# Patient Record
Sex: Male | Born: 1980
Health system: Southern US, Community
[De-identification: ages and names within clinical notes are randomized; demographics above are authoritative.]

## PROBLEM LIST (undated history)

## (undated) DIAGNOSIS — E119 Type 2 diabetes mellitus without complications: Secondary | ICD-10-CM

## (undated) DIAGNOSIS — I10 Essential (primary) hypertension: Secondary | ICD-10-CM

## (undated) HISTORY — PX: EYE SURGERY: SHX253

---

## 1998-08-29 ENCOUNTER — Emergency Department (HOSPITAL_COMMUNITY): Admission: EM | Admit: 1998-08-29 | Discharge: 1998-08-29 | Payer: Self-pay | Admitting: Emergency Medicine

## 2001-04-16 ENCOUNTER — Emergency Department (HOSPITAL_COMMUNITY): Admission: EM | Admit: 2001-04-16 | Discharge: 2001-04-17 | Payer: Self-pay | Admitting: Emergency Medicine

## 2006-09-28 ENCOUNTER — Emergency Department (HOSPITAL_COMMUNITY): Admission: EM | Admit: 2006-09-28 | Discharge: 2006-09-28 | Payer: Self-pay | Admitting: Emergency Medicine

## 2014-11-15 ENCOUNTER — Encounter (HOSPITAL_BASED_OUTPATIENT_CLINIC_OR_DEPARTMENT_OTHER): Payer: Self-pay | Admitting: Emergency Medicine

## 2014-11-15 ENCOUNTER — Emergency Department (HOSPITAL_BASED_OUTPATIENT_CLINIC_OR_DEPARTMENT_OTHER)
Admission: EM | Admit: 2014-11-15 | Discharge: 2014-11-15 | Disposition: A | Discharge status: Home / Self Care | Payer: BC Managed Care – PPO | Attending: Emergency Medicine | Admitting: Emergency Medicine

## 2014-11-15 ENCOUNTER — Ambulatory Visit (HOSPITAL_BASED_OUTPATIENT_CLINIC_OR_DEPARTMENT_OTHER)
Admission: RE | Admit: 2014-11-15 | Discharge: 2014-11-15 | Disposition: A | Discharge status: Home / Self Care | Payer: BC Managed Care – PPO | Source: Ambulatory Visit | Attending: Emergency Medicine | Admitting: Emergency Medicine

## 2014-11-15 ENCOUNTER — Emergency Department (HOSPITAL_BASED_OUTPATIENT_CLINIC_OR_DEPARTMENT_OTHER): Payer: BC Managed Care – PPO

## 2014-11-15 DIAGNOSIS — E1165 Type 2 diabetes mellitus with hyperglycemia: Secondary | ICD-10-CM | POA: Insufficient documentation

## 2014-11-15 DIAGNOSIS — Z794 Long term (current) use of insulin: Secondary | ICD-10-CM | POA: Diagnosis not present

## 2014-11-15 DIAGNOSIS — M7989 Other specified soft tissue disorders: Secondary | ICD-10-CM | POA: Insufficient documentation

## 2014-11-15 DIAGNOSIS — Z792 Long term (current) use of antibiotics: Secondary | ICD-10-CM | POA: Insufficient documentation

## 2014-11-15 DIAGNOSIS — M79604 Pain in right leg: Secondary | ICD-10-CM | POA: Diagnosis not present

## 2014-11-15 DIAGNOSIS — L03115 Cellulitis of right lower limb: Secondary | ICD-10-CM | POA: Diagnosis not present

## 2014-11-15 DIAGNOSIS — Z23 Encounter for immunization: Secondary | ICD-10-CM | POA: Insufficient documentation

## 2014-11-15 DIAGNOSIS — R739 Hyperglycemia, unspecified: Secondary | ICD-10-CM

## 2014-11-15 DIAGNOSIS — L0390 Cellulitis, unspecified: Secondary | ICD-10-CM

## 2014-11-15 DIAGNOSIS — I10 Essential (primary) hypertension: Secondary | ICD-10-CM | POA: Insufficient documentation

## 2014-11-15 HISTORY — DX: Type 2 diabetes mellitus without complications: E11.9

## 2014-11-15 LAB — BASIC METABOLIC PANEL
Anion gap: 6 (ref 5–15)
BUN: 14 mg/dL (ref 6–23)
CO2: 31 mmol/L (ref 19–32)
Calcium: 8.9 mg/dL (ref 8.4–10.5)
Chloride: 96 mEq/L (ref 96–112)
Creatinine, Ser: 1.05 mg/dL (ref 0.50–1.35)
GFR calc non Af Amer: >90 mL/min (ref >90)
Glucose, Bld: 423 mg/dL — ABNORMAL HIGH (ref 70–99)
Potassium: 4.3 mmol/L (ref 3.5–5.1)
SODIUM: 133 mmol/L — AB (ref 135–145)

## 2014-11-15 LAB — CBC WITH DIFFERENTIAL/PLATELET
BASOS PCT: 0 % (ref 0–1)
Basophils Absolute: 0 10*3/uL (ref 0.0–0.1)
EOS ABS: 0 10*3/uL (ref 0.0–0.7)
Eosinophils Relative: 0 % (ref 0–5)
HCT: 39.7 % (ref 39.0–52.0)
HEMOGLOBIN: 13.7 g/dL (ref 13.0–17.0)
LYMPHS ABS: 2.3 10*3/uL (ref 0.7–4.0)
Lymphocytes Relative: 19 % (ref 12–46)
MCH: 30.4 pg (ref 26.0–34.0)
MCHC: 34.5 g/dL (ref 30.0–36.0)
MCV: 88 fL (ref 78.0–100.0)
MONOS PCT: 7 % (ref 3–12)
Monocytes Absolute: 0.9 10*3/uL (ref 0.1–1.0)
NEUTROS PCT: 74 % (ref 43–77)
Neutro Abs: 8.5 10*3/uL — ABNORMAL HIGH (ref 1.7–7.7)
Platelets: 236 10*3/uL (ref 150–400)
RBC: 4.51 MIL/uL (ref 4.22–5.81)
RDW: 11.2 % — ABNORMAL LOW (ref 11.5–15.5)
WBC: 11.7 10*3/uL — ABNORMAL HIGH (ref 4.0–10.5)

## 2014-11-15 LAB — CBG MONITORING, ED: Glucose-Capillary: 304 mg/dL — ABNORMAL HIGH (ref 70–99)

## 2014-11-15 LAB — D-DIMER, QUANTITATIVE (NOT AT ARMC): D DIMER QUANT: 0.49 ug{FEU}/mL — AB (ref 0.00–0.48)

## 2014-11-15 MED ORDER — INSULIN REGULAR HUMAN 100 UNIT/ML IJ SOLN
5.0000 [IU] | Freq: Once | INTRAMUSCULAR | Status: AC
  Administered 2014-11-15: 5 [IU] via SUBCUTANEOUS
  Filled 2014-11-15: qty 1

## 2014-11-15 MED ORDER — VANCOMYCIN HCL IN DEXTROSE 1-5 GM/200ML-% IV SOLN
1000.0000 mg | Freq: Once | INTRAVENOUS | Status: DC
  Filled 2014-11-15: qty 200

## 2014-11-15 MED ORDER — CEPHALEXIN 500 MG PO CAPS: 500.0000 mg | ORAL_CAPSULE | Freq: Four times a day (QID) | ORAL | Status: DC

## 2014-11-15 MED ORDER — SODIUM CHLORIDE 0.9 % IV BOLUS (SEPSIS)
1000.0000 mL | Freq: Once | INTRAVENOUS | Status: AC
  Administered 2014-11-15: 1000 mL via INTRAVENOUS

## 2014-11-15 MED ORDER — TETANUS-DIPHTH-ACELL PERTUSSIS 5-2.5-18.5 LF-MCG/0.5 IM SUSP
0.5000 mL | Freq: Once | INTRAMUSCULAR | Status: AC
  Administered 2014-11-15: 0.5 mL via INTRAMUSCULAR
  Filled 2014-11-15: qty 0.5

## 2014-11-15 MED ORDER — SULFAMETHOXAZOLE-TRIMETHOPRIM 800-160 MG PO TABS
1.0000 | ORAL_TABLET | Freq: Two times a day (BID) | ORAL | Status: DC
Start: 2014-11-15 — End: 2019-06-15

## 2014-11-15 NOTE — Discharge Instructions (Signed)
Cellulitis Take the antibiotics as prescribed. Return tomorrow for an ultrasound of your leg.  Establish care with a primary doctor to manage your blood pressure and blood sugar.  Have your leg checked in 48-72 hours if not improving.  Return to the ED sooner with worsening pain, redness, swelling, fever, or any other concerns. Cellulitis is an infection of the skin and the tissue beneath it. The infected area is usually red and tender. Cellulitis occurs most often in the arms and lower legs.  CAUSES  Cellulitis is caused by bacteria that enter the skin through cracks or cuts in the skin. The most common types of bacteria that cause cellulitis are staphylococci and streptococci. SIGNS AND SYMPTOMS   Redness and warmth.  Swelling.  Tenderness or pain.  Fever. DIAGNOSIS  Your health care provider can usually determine what is wrong based on a physical exam. Blood tests may also be done. TREATMENT  Treatment usually involves taking an antibiotic medicine. HOME CARE INSTRUCTIONS   Take your antibiotic medicine as directed by your health care provider. Finish the antibiotic even if you start to feel better.  Keep the infected arm or leg elevated to reduce swelling.  Apply a warm cloth to the affected area up to 4 times per day to relieve pain.  Take medicines only as directed by your health care provider.  Keep all follow-up visits as directed by your health care provider. SEEK MEDICAL CARE IF:   You notice red streaks coming from the infected area.  Your red area gets larger or turns dark in color.  Your bone or joint underneath the infected area becomes painful after the skin has healed.  Your infection returns in the same area or another area.  You notice a swollen bump in the infected area.  You develop new symptoms.  You have a fever. SEEK IMMEDIATE MEDICAL CARE IF:   You feel very sleepy.  You develop vomiting or diarrhea.  You have a general ill feeling (malaise)  with muscle aches and pains. MAKE SURE YOU:   Understand these instructions.  Will watch your condition.  Will get help right away if you are not doing well or get worse. Document Released: 07/23/2005 Document Revised: 02/27/2014 Document Reviewed: 12/29/2011 [facility] Patient Information 2015 [place], [place]. This information is not intended to replace advice given to you by your health care provider. Make sure you discuss any questions you have with your health care provider.   Emergency Department Resource Guide 1) Find a Doctor and Pay Out of Pocket Although you won't have to find out who is covered by your insurance plan, it is a good idea to ask around and get recommendations. You will then need to call the office and see if the doctor you have chosen will accept you as a new patient and what types of options they offer for patients who are self-pay. Some doctors offer discounts or will set up payment plans for their patients who do not have insurance, but you will need to ask so you aren't surprised when you get to your appointment.  2) Contact Your Local Health Department Not all health departments have doctors that can see patients for sick visits, but many do, so it is worth a call to see if yours does. If you don't know where your local health department is, you can check in your phone book. The CDC also has a tool to help you locate your state's health department, and many state websites also have  listings of all of their local health departments.  3) Find a Walk-in Clinic If your illness is not likely to be very severe or complicated, you may want to try a walk in clinic. These are popping up all over the country in pharmacies, drugstores, and shopping centers. They're usually staffed by nurse practitioners or physician assistants that have been trained to treat common illnesses and complaints. They're usually fairly quick and inexpensive. However, if you have serious medical issues  or chronic medical problems, these are probably not your best option.  No Primary Care Doctor: - Call Health Connect at  [phone] - they can help you locate a primary care doctor that  accepts your insurance, provides certain services, etc. - Physician Referral Service- [phone]  Chronic Pain Problems: Organization         Address  Phone   Notes  [facility]  [phone] Patients need to be referred by their primary care doctor.   Medication Assistance: Organization         Address  Phone   Notes  [facility] Medication [facility] [address] [place], [place] [address] [phone] --Must be a resident of [facility] [place] -- Must have NO insurance coverage whatsoever (no Medicaid/ Medicare, etc.) -- The pt. MUST have a primary care doctor that directs their care regularly and follows them in the community   MedAssist  [phone]   [employer]  [phone]    Agencies that provide inexpensive medical care: Organization         Address  Phone   Notes  [facility]  [phone]   [facility]    [phone]   [facility] [address] [place], [place] [address] [phone]   [facility] [address], [place] [phone]   [facility]anned Parenthood    [phone]   [facility]    [phone]   [facility]  [address], [place] Phone:  [phone], Fax:  [phone] Hours of Operation:  9 am - 6 pm, M-F.  Also accepts Medicaid/Medicare and self-pay.  [facility]  [address], [place] Phone: [phone], Fax: [phone]. Hours of Operation:  8:30 am - 5:30 pm, M-F.  Also accepts Medicaid and self-pay.  [facility] [place] [address], [place] Phone: [phone]   [facility] [address], [place], [place]  [phone], Ext. [phone] Mondays & Thursdays: 7-9 AM.  First 15 patients are seen on a first come, first serve basis.    Medicaid-accepting [facility] Providers:  Organization         Address  Phone   Notes  [facility] [address], [place] [phone] Also accepts self-pay patients.  [facility] [address], [place]  [phone]   [facility] [address], [place] [phone]   [facility] [address] [place] [address], [place] [phone]   Renaye RakersVeita Bland [address], [place]   [phone] Only accepts [place] Access [place] patients after they have their name applied to their card.   Self-Pay (no insurance) in [facility] [place]:  [employer]ganization         Address  Phone  Notes  Sickle Cell Patients, [place] [phone]   [facility] [place] [phone]   [facility] [place]  [place], [place], [place] [phone]   [facility]/Dr. Osei-Bonsu  [address], [place] or [address], [place] [phone] Phone number for both [place] and [place] locations is the same.  [facility] [address], [place] [phone]   [facility] [address] [place] or [address] [phone]   [facility] [address] [place] [phone], phone; [phone], fax Sees patients 1st and 3rd Saturday of every month.  Must not qualify for public or private insurance (i.e. Medicaid, Medicare, Advance Health Choice, Veterans' Benefits)  Household income should be no more than 200% of the poverty level The clinic cannot treat you if you are pregnant or think you are pregnant  Sexually transmitted  diseases are not treated at the clinic.    Dental Care: Organization         Address  Phone  Notes  [facility] Department of [facility] [phone] Accepts children up to age 15 who are enrolled in [place] or [place]; pregnant women with a Medicaid card; and children who have applied for Medicaid or [place] Health Choice, but were declined, whose parents can pay a reduced fee at time of service.  [facility]  [address], [place] [phone] Accepts children up to age 36 who are enrolled in [place] or [place]; pregnant women with a Medicaid card; and children who have applied for Medicaid or [place] Health Choice, but were declined, whose parents can pay a reduced fee at time of service.  [facility]  [place] [phone] Patients are seen by appointment only. Walk-ins are not accepted. [place] will see patients 55 years of age and older. Monday - Tuesday (8am-5pm) Most Wednesdays (8:30-5pm) $30 per visit, cash only  [facility]  [address], [facility] [phone] Patients are seen by appointment only. Walk-ins are not accepted. [place] will see patients 37 years of age and older. One Wednesday Evening (Monthly: Volunteer Based).  $30 per visit, cash only  [place]  [phone] for adults; Children under age 70, call Graduate Pediatric Dentistry at [phone]. Children aged 67-14, please call [phone] to request a pediatric application.  Dental services are provided in all areas of dental care including fillings, crowns and bridges, complete and partial dentures, implants, gum treatment, root canals, and extractions. Preventive care is also provided. Treatment is provided to both adults and children. Patients are selected via a  lottery and there is often a waiting list.   [facility] [address] [place]  [phone] [email or web address]   [facility] [address] [place], [place] [phone], Ext. [phone] Second and Fourth Thursday of each month, opens at 6:30 AM; Clinic ends at 9 AM.  Patients are seen on a first-come first-served basis, and a limited number are seen during each clinic.   [facility]  [address] Hillard Danker [place], [place] [phone]  Eligibility Requirements You must have lived in [place], [place], or [place] counties for at least the last three months.   You cannot be eligible for state or federal sponsored [employer], including [employer], [place], or [employer].   You generally cannot be eligible for healthcare insurance through your employer.    How to apply: Eligibility screenings are held every Tuesday and Wednesday afternoon from 1:00 pm until 4:00 pm. You do not need an appointment for the interview!  [facility] [address] [place], [place]   [place]  [place] Department  [place]  [phone]    Behavioral Health Resources in the Community: Intensive Outpatient Programs Organization         Address  Phone  Notes  [place]. [address] [place], [place] [phone]   [facility] Outpatient [address], [place], [place]   ADS: Alcohol & Drug Svcs [address] [place], [place]   [place] [address],  [place], [place] or [phone]   Substance Abuse Resources Organization         Address  Phone  Notes  Alcohol and Drug Services  [phone]   [place]  [phone]   The [place]   Chinita Pester  [phone]   Residential &  Outpatient Substance Abuse Program  [phone]   Psychological Services Organization         Address  Phone  Notes  [facility]  [place]  [phone]   [place] [address], [place] or [phone]    Mobile Crisis Teams Organization         Address  Phone  Notes  [facility], Mobile Crisis Care Unit  [phone]   [facility]  [address] [place], [place]   [place] Levels [address] [place] [phone]    Self-Help/Support Groups Organization         Address  Phone             Notes  [place]. of [place] - variety of support groups  [place] Call for more information  Narcotics Anonymous (NA), [facility] [address], [employer] [place]  2 meetings at this location   Special educational needs teacher         Address  Phone  Notes  [facility]idential [facility] [place],    [place]  [phone]   [facility]  [address], [place] [id number], [place], [place]   [place], [place] [phone] Admissions: 8am-3pm M-F  Incentives Substance [place] [address]    [place], [place] [phone]   [facility] [address] Jadene Pierini [place], [place]   [facility] [address]  [place], [place]   [facility] - Intensive Outpatient [address], [place], [place]   [facility] [place].,  [place], [place] or [phone]   [facility] (RTS) [address], [place], [place] Accepts Medicaid  [facility] [place] [address]  [place] [phone] Substance Abuse/Addiction Treatment   [facility] Resources Organization  Address  Phone  Notes  °[facility]  [phone]   °Julie Brannon, PhD [address] [place], [place]   [phone] or [phone]   °[facility]   [address] °[place], [place] [phone]   °[facility] [address], [place], [place] [phone] Insurance/Medicaid/sponsorship through [facility]  °[facility] [address]                                    [place], [place] [phone] Therapy/tele-psych/case  °[facility] [address]  ° [place], [place] [phone]    °Dr. Arfeen  [phone]   °[facility] [place]  [employer] [place] [facility] 1) [address], [place] °2) [address], [place] °3)  [address], [place] [phone]) [phone] °([phone] ° °([phone]   °[place] Child Abuse Hotline [phone]) [phone] or (336) [phone] (After Hours)    ° ° ° °

## 2014-11-15 NOTE — ED Provider Notes (Signed)
CSN: [id number]     Arrival date & time 11/15/14  0037 History   First MD Initiated Contact with Patient 11/15/14 [phone]     Chief Complaint  Patient presents with  . leg swelling      (Consider location/radiation/quality/duration/timing/severity/associated sxs/prior Treatment) HPI Comments: Patient states he injured his right shin against a metal trailer around Christmas. He did not seek treatment at the time. He had several open wounds. Over the past 2 days had increasing pain, swelling and redness to his right anterior shin. He is a diabetic on insulin but does not check his blood sugars. He denies any fevers, chills, nausea vomiting. No chest pain or shortness of breath. No history of blood clots. Admits to picking at one of the wounds on his right shin. He is concerned for infection of the leg. Denies IV drug abuse  The history is provided by the patient.    Past Medical History  Diagnosis Date  . Diabetes mellitus without complication    History reviewed. No pertinent past surgical history. History reviewed. No pertinent family history. History  Substance Use Topics  . Smoking status: Never Smoker   . Smokeless tobacco: Not on file  . Alcohol Use: No    Review of Systems  Constitutional: Negative for fever, activity change and appetite change.  HENT: Negative for congestion and rhinorrhea.   Eyes: Negative for visual disturbance.  Respiratory: Negative for cough, chest tightness and shortness of breath.   Cardiovascular: Negative for chest pain.  Gastrointestinal: Negative for nausea, vomiting and abdominal pain.  Genitourinary: Negative for dysuria and hematuria.  Musculoskeletal: Positive for arthralgias. Negative for joint swelling.  Skin: Positive for rash and wound.  Neurological: Negative for dizziness, weakness and headaches.  A complete 10 system review of systems was obtained and all systems are negative except as noted in the HPI and PMH.      Allergies    Review of patient's allergies indicates no known allergies.  Home Medications   Prior to Admission medications   Medication Sig Start Date End Date Taking? Authorizing Provider  insulin aspart (NOVOLOG) 100 unit/mL injection Inject into the skin 3 (three) times daily before meals.   Yes Historical Provider, MD  cephALEXin (KEFLEX) 500 MG capsule Take 1 capsule (500 mg total) by mouth 4 (four) times daily. 11/15/14   Glynn Octave, MD  sulfamethoxazole-trimethoprim (SEPTRA DS) 800-160 MG per tablet Take 1 tablet by mouth 2 (two) times daily. 11/15/14   Glynn Octave, MD   BP 153/102 mmHg  Pulse 99  Temp(Src) 98.6 F (37 C) (Oral)  Resp 16  Ht 6' (1.829 m)  Wt 195 lb (88.451 kg)  BMI 26.44 kg/m2  SpO2 96% Physical Exam  Constitutional: He is oriented to person, place, and time. He appears well-developed and well-nourished. No distress.  HENT:  Head: Normocephalic and atraumatic.  Mouth/Throat: Oropharynx is clear and moist. No oropharyngeal exudate.  Eyes: Conjunctivae and EOM are normal. Pupils are equal, round, and reactive to light.  Neck: Normal range of motion. Neck supple.  No meningismus.  Cardiovascular: Normal rate, regular rhythm, normal heart sounds and intact distal pulses.   No murmur heard. Pulmonary/Chest: Effort normal and breath sounds normal. No respiratory distress.  Abdominal: Soft. There is no tenderness. There is no rebound and no guarding.  Musculoskeletal: Normal range of motion. He exhibits tenderness. He exhibits no edema.  Erythema to right shin with central hyperkeratotic scabbed lesion. Intact DP and PT pulses Edema of right shin  and ankle. Negative Homans sign  Both right and left leg have several scabbed areas of previous wounds.  Neurological: He is alert and oriented to person, place, and time. No cranial nerve deficit. He exhibits normal muscle tone. Coordination normal.  No ataxia on finger to nose bilaterally. No pronator drift. 5/5 strength  throughout. CN 2-12 intact. Negative Romberg. Equal grip strength. Sensation intact. Gait is normal.   Skin: Skin is warm.  Psychiatric: He has a normal mood and affect. His behavior is normal.  Nursing note and vitals reviewed.     ED Course  Procedures (including critical care time) Labs Review Labs Reviewed  CBC WITH DIFFERENTIAL - Abnormal; Notable for the following:    WBC 11.7 (*)    RDW 11.2 (*)    Neutro Abs 8.5 (*)    All other components within normal limits  BASIC METABOLIC PANEL - Abnormal; Notable for the following:    Sodium 133 (*)    Glucose, Bld 423 (*)    All other components within normal limits  D-DIMER, QUANTITATIVE - Abnormal; Notable for the following:    D-Dimer, Quant 0.49 (*)    All other components within normal limits  CBG MONITORING, ED - Abnormal; Notable for the following:    Glucose-Capillary 304 (*)    All other components within normal limits    Imaging Review Dg Tibia/fibula Right  11/15/2014   CLINICAL DATA:  Injury 4 weeks ago with wound in the mid shin. Redness for 3 days with possible cellulitis.  EXAM: RIGHT TIBIA AND FIBULA - 2 VIEW  COMPARISON:  None.  FINDINGS: There is chronic irregularity of the lateral malleolus compatible with remote injury. No acute fracture or malalignment. No opaque foreign body or subcutaneous gas.  IMPRESSION: No acute findings.   Electronically Signed   By: Tiburcio PeaJonathan  Watts M.D.   On: 11/15/2014 01:49     EKG Interpretation None      MDM   Final diagnoses:  Cellulitis  Essential hypertension  Hyperglycemia   Cellulitis to right leg. Patient is nontoxic and afebrile. Bedside ultrasound shows no fluid collection or drainable abscess.  WBC 11.7. Hyperglycemia without evidence of DKA. X-ray negative for foreign body or soft tissue gas.  Treat for cellulitis. DVT considered less likely. D-dimer 0.49. No chest pain or shortness of breath. Doubt PE. Low suspicion for DVT but will check doppler in AM.   Patient concerned about blood thinner so will defer lovenox at this time.  HR improved to 90s.  BP remains elevated. Blood sugar improved to 304.  D/w patient need for followup with PCP regarding BP and better glucose control. Advised to have cellulitis rechecked by PCP in 2 days. Return to the ED sooner with spreading redness, fever, worsening pain, or any other concerns.    EMERGENCY DEPARTMENT US SOFT TISSUE INTERPRETATION "Study: Limited Ultrasound of the noted body part in comments below"  INDICATIONS: Soft tissue infection Multiple views of the body part are obtained with a multi-frequency linear probe  PERFORMED BY:  Myself  IMAGES ARCHIVED?: Yes  SIDE:Right   BODY PART:Lower extremity  FINDINGS: No abcess noted and Cellulitis present  LIMITATIONS:  Body Habitus  INTERPRETATION:  No abcess noted and Cellulitis present  COMMENT:        Glynn OctaveStephen Tell Rozelle, MD 11/15/14 [phone]

## 2014-11-15 NOTE — ED Notes (Signed)
Pt states that his right leg has been swelling since Sunday

## 2014-11-15 NOTE — ED Notes (Signed)
C/o redness, swelling and pain to rt lower leg x 2 days  States had inj 4 weeks ago

## 2015-07-10 IMAGING — CR DG TIBIA/FIBULA 2V*R*
4 series · 4 of 4 positions shown · non-contrast
Comparison: None.

CLINICAL DATA: Injury 4 weeks ago with wound in the mid shin.
Redness for 3 days with possible cellulitis.

EXAM:
RIGHT TIBIA AND FIBULA - 2 VIEW

[t tib/fib ap right (1 of 2)]
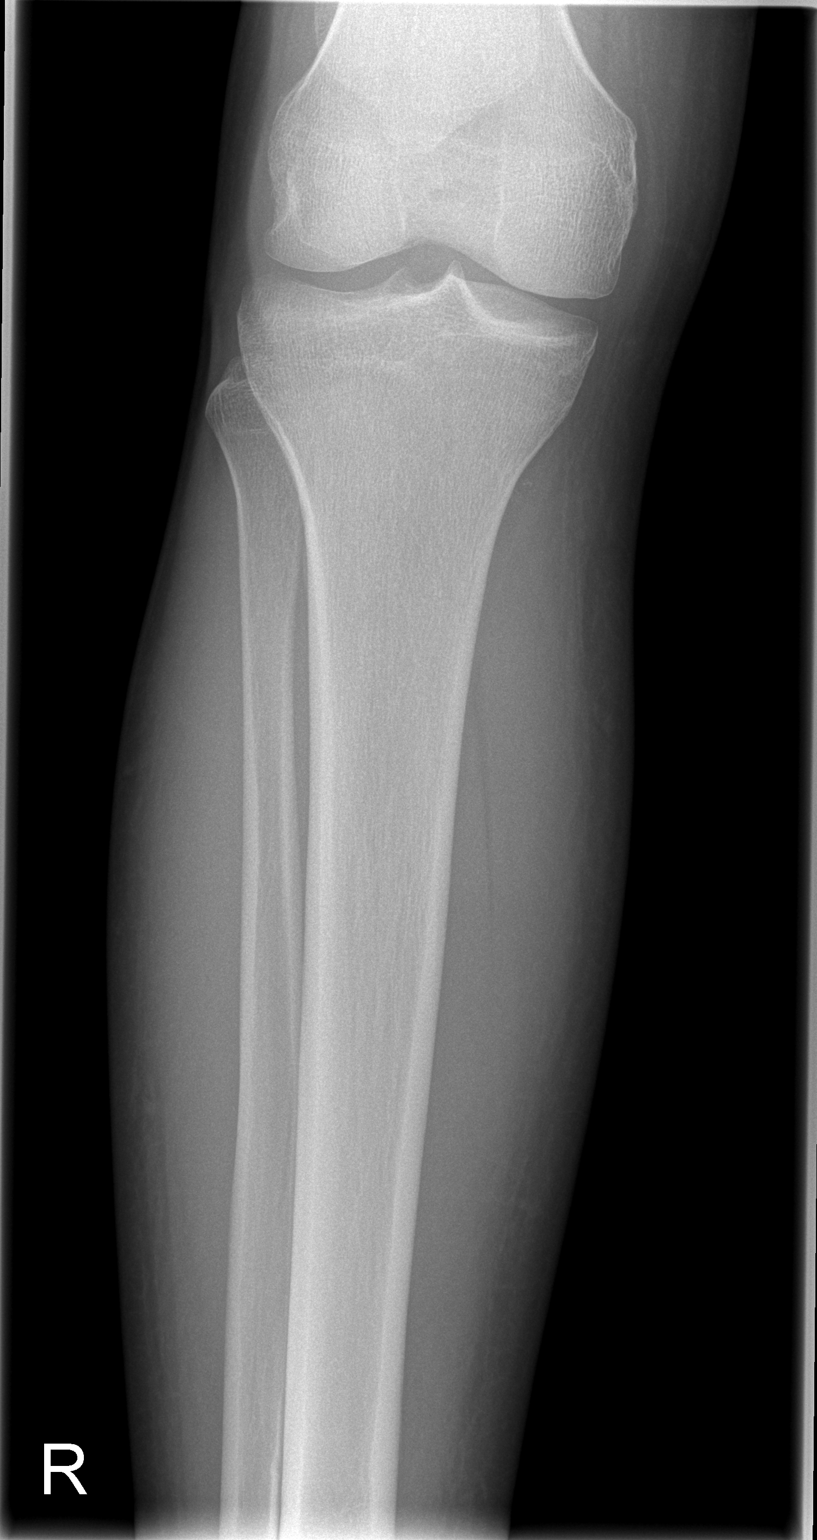

[t tib/fib ap right (2 of 2)]
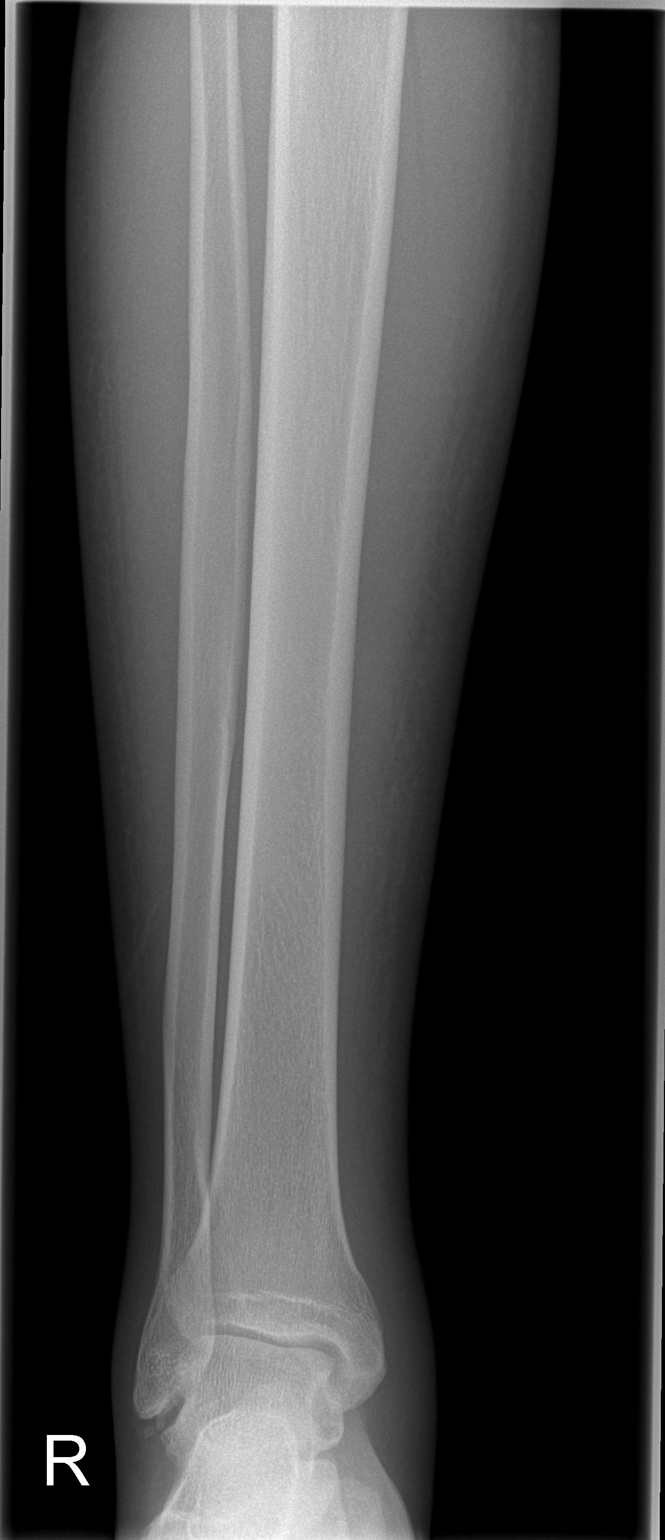

[t tib/fib lat right (1 of 2)]
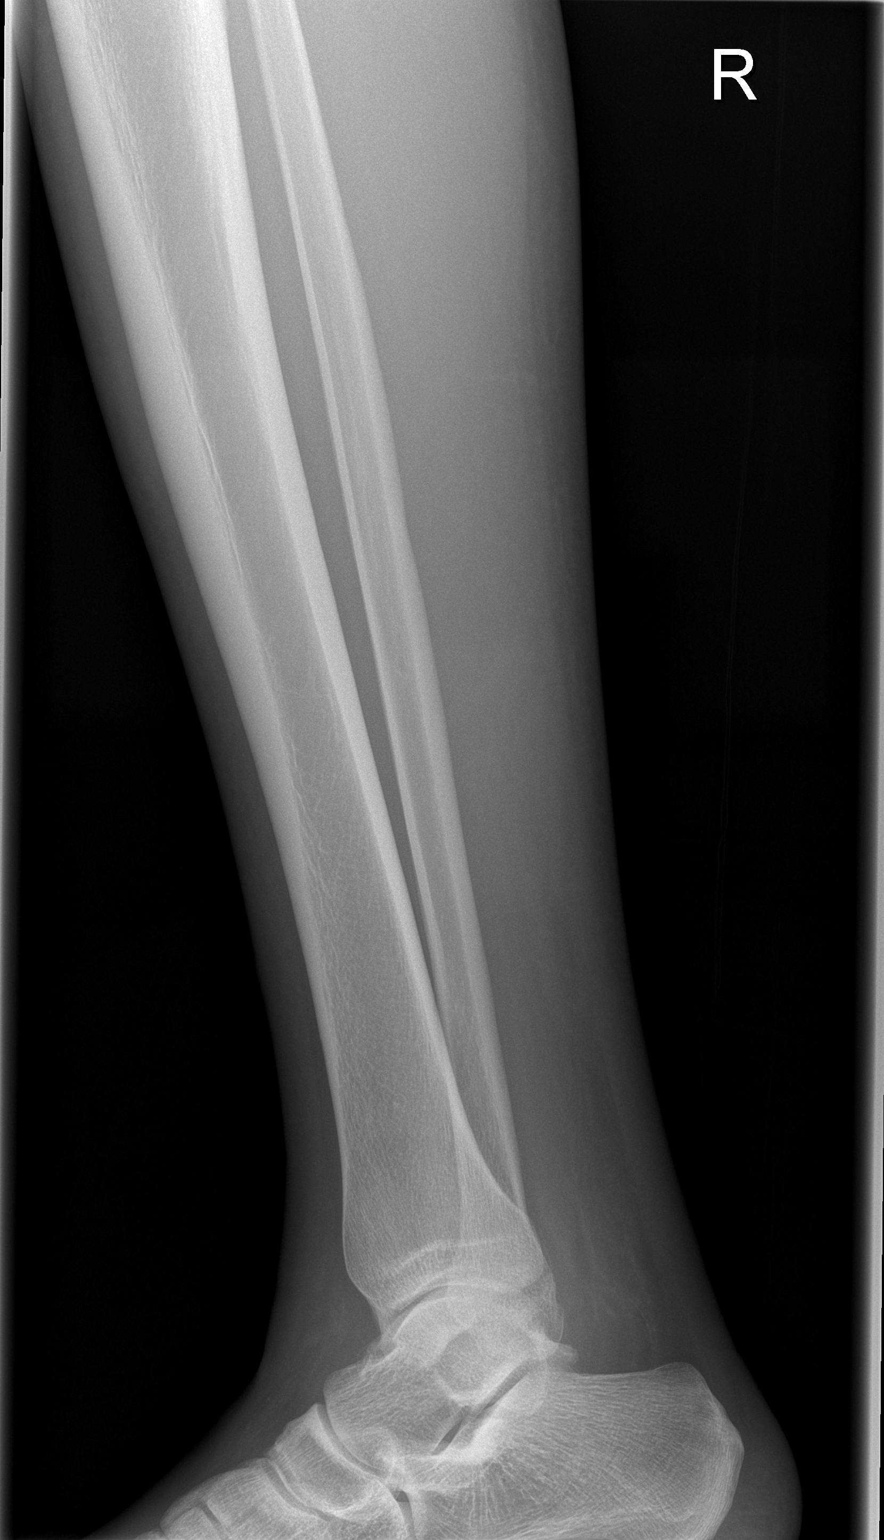

[t tib/fib lat right (2 of 2)]
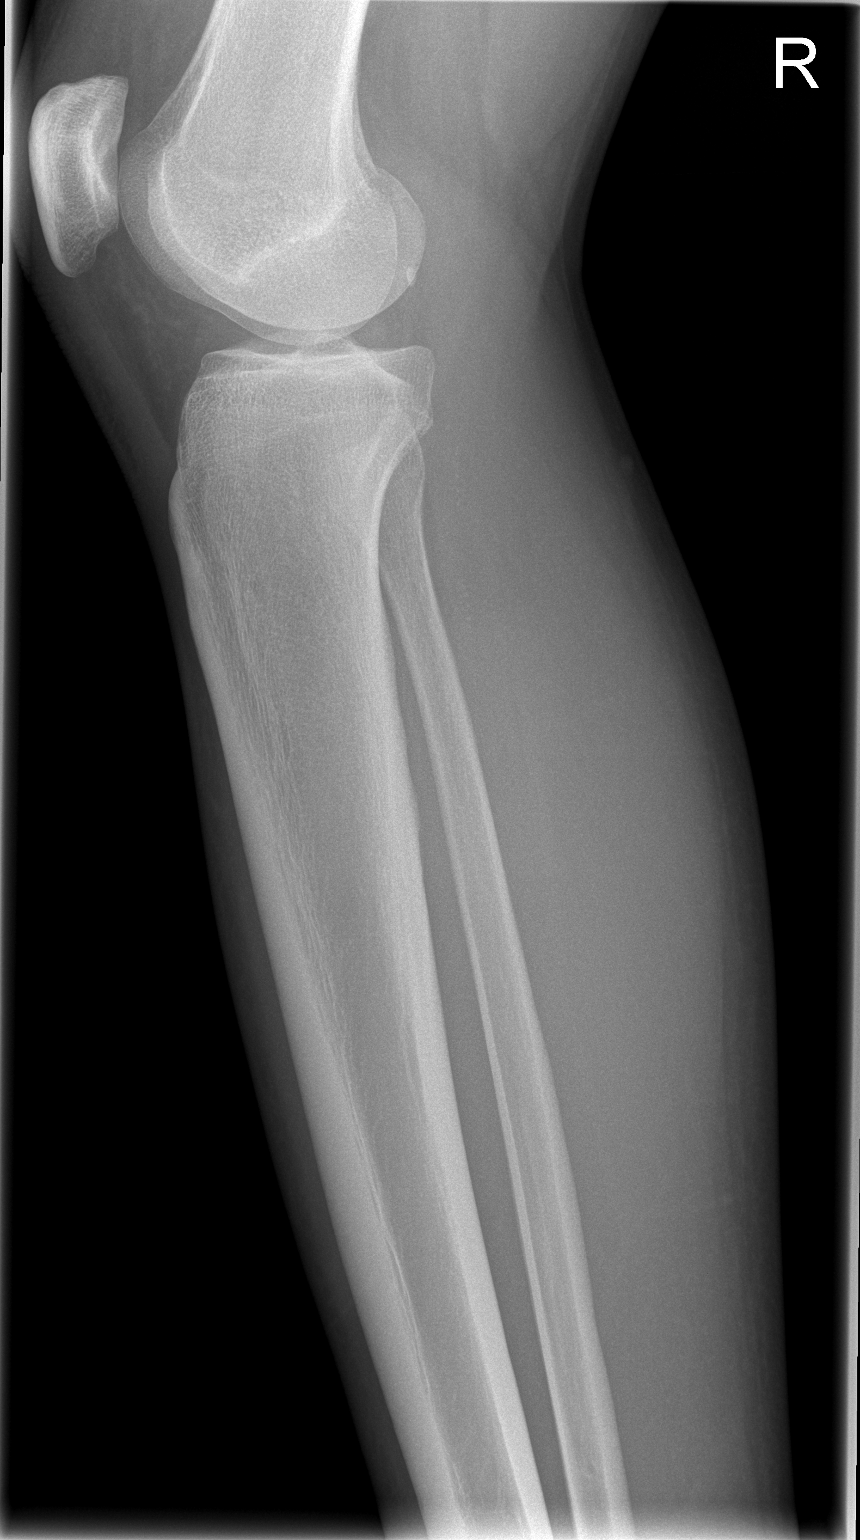

[4 of 4 positions shown; findings below may reference images not displayed]

FINDINGS: There is chronic irregularity of the lateral malleolus compatible
with remote injury. No acute fracture or malalignment. No opaque
foreign body or subcutaneous gas.
IMPRESSION: No acute findings.

## 2019-06-15 ENCOUNTER — Encounter: Payer: Self-pay | Admitting: Emergency Medicine

## 2019-06-15 ENCOUNTER — Other Ambulatory Visit: Payer: Self-pay

## 2019-06-15 ENCOUNTER — Ambulatory Visit
Admission: EM | Admit: 2019-06-15 | Discharge: 2019-06-15 | Disposition: A | Discharge status: Home / Self Care | Payer: BC Managed Care – PPO | Attending: Family Medicine | Admitting: Family Medicine

## 2019-06-15 DIAGNOSIS — K029 Dental caries, unspecified: Secondary | ICD-10-CM

## 2019-06-15 HISTORY — DX: Essential (primary) hypertension: I10

## 2019-06-15 MED ORDER — AMOXICILLIN-POT CLAVULANATE 875-125 MG PO TABS: 1.0000 | ORAL_TABLET | Freq: Two times a day (BID) | ORAL | 0 refills | Status: DC

## 2019-06-15 MED ORDER — HYDROCODONE-ACETAMINOPHEN 5-325 MG PO TABS: 1.0000 | ORAL_TABLET | Freq: Three times a day (TID) | ORAL | 0 refills | Status: DC | PRN

## 2019-06-15 NOTE — ED Provider Notes (Signed)
[facility]    CSN: [id number] Arrival date & time: 06/15/19  1238  History   Chief Complaint Chief Complaint  Patient presents with  . Dental Pain   HPI  38 year old male presents with dental pain.  Patient reports that he developed dental pain approximately 2 days ago.  He has not seen his dentist recently.  He states that his pain is 10/10 in severity.  Pain is predominantly located in the lower dentition (left incisor).  He has very poor dentition and rotting teeth and missing teeth throughout.  He has taken ibuprofen without resolution.  He has called his dentist and has an upcoming appointment.  No fever.  No reports of facial swelling.  No other complaints at this time.  History reviewed as below. Past Medical History:  Diagnosis Date  . Diabetes mellitus without complication ([place])   . Hypertension     Home Medications    Prior to Admission medications   Medication Sig Start Date End Date Taking? Authorizing Provider  amLODipine (NORVASC) 10 MG tablet Take 1 tablet by mouth daily 01/02/19  Yes [provider]  insulin aspart (NOVOLOG) 100 unit/mL injection Inject into the skin 3 (three) times daily before meals.   Yes [provider]  insulin NPH Human (NOVOLIN N) 100 UNIT/ML injection Inject 30 Units under the skin 2 times daily before meals 03/30/17  Yes [provider]  lisinopril (ZESTRIL) 20 MG tablet Take by mouth. 03/30/17  Yes [provider]  amoxicillin-clavulanate (AUGMENTIN) 875-125 MG tablet Take 1 tablet by mouth every 12 (twelve) hours. 06/15/19   Coral Spikes, DO  HYDROcodone-acetaminophen (NORCO/VICODIN) 5-325 MG tablet Take 1 tablet by mouth every 8 (eight) hours as needed. 06/15/19   Coral Spikes, DO   Social History Social History   Tobacco Use  . Smoking status: Never Smoker  . Smokeless tobacco: Never Used  Substance Use Topics  . Alcohol use: No  . Drug use: No    Allergies   Patient has no known  allergies.   Review of Systems Review of Systems  Constitutional: Negative for fever.  HENT: Positive for dental problem.    Physical Exam Triage Vital Signs ED Triage Vitals  Enc Vitals Group     BP 06/15/19 1256 (!) 160/90     Pulse Rate 06/15/19 1256 100     Resp 06/15/19 1256 18     Temp 06/15/19 1256 99.4 F (37.4 C)     Temp Source 06/15/19 1256 Oral     SpO2 06/15/19 1256 97 %     Weight 06/15/19 1254 213 lb (96.6 kg)     Height 06/15/19 1254 6' (1.829 m)     Head Circumference --      Peak Flow --      Pain Score 06/15/19 1253 10     Pain Loc --      Pain Edu? --      Excl. in [place]? --    Updated Vital Signs BP (!) 160/90 (BP Location: Right Arm)   Pulse 100   Temp 99.4 F (37.4 C) (Oral)   Resp 18   Ht 6' (1.829 m)   Wt 96.6 kg   SpO2 97%   BMI 28.89 kg/m   Visual Acuity Right Eye Distance:   Left Eye Distance:   Bilateral Distance:    Right Eye Near:   Left Eye Near:    Bilateral Near:     Physical Exam Vitals  signs and nursing note reviewed.  Constitutional:      General: He is not in acute distress.    Appearance: Normal appearance.  HENT:     Head: Normocephalic and atraumatic.     Mouth/Throat:      Comments: Patient has numerous missing teeth.  Gum disease noted.  Pain is located at the labeled tooth tooth is discolored. Eyes:     General:        Right eye: No discharge.        Left eye: No discharge.     Conjunctiva/sclera: Conjunctivae normal.  Cardiovascular:     Rate and Rhythm: Regular rhythm. Tachycardia present.  Pulmonary:     Effort: Pulmonary effort is normal.     Breath sounds: Normal breath sounds. No wheezing, rhonchi or rales.  Neurological:     Mental Status: He is alert.  Psychiatric:        Mood and Affect: Mood normal.    UC Treatments / Results  Labs (all labs ordered are listed, but only abnormal results are displayed) Labs Reviewed - No data to display  EKG   Radiology No results found.  Procedures  Procedures (including critical care time)  Medications Ordered in UC Medications - No data to display  Initial Impression / Assessment and Plan / UC Course  I have reviewed the triage vital signs and the nursing notes.  Pertinent labs & imaging results that were available during my care of the patient were reviewed by me and considered in my medical decision making (see chart for details).    38 year old male presents with dental pain.  Suspected infection.  Placing on Augmentin.  Vicodin as needed for pain.  [place] controlled substance database reviewed.  No concerns for abuse.  Advised to see dentist.  Final Clinical Impressions(s) / UC Diagnoses   Final diagnoses:  Pain due to dental caries     Discharge Instructions     Medications as prescribed.  Be sure to see your dentist.  Take care  Dr. Adriana Simasook    ED Prescriptions    Medication Sig Dispense Auth. Provider   amoxicillin-clavulanate (AUGMENTIN) 875-125 MG tablet Take 1 tablet by mouth every 12 (twelve) hours. 14 tablet Deontray Hunnicutt G, DO   HYDROcodone-acetaminophen (NORCO/VICODIN) 5-325 MG tablet Take 1 tablet by mouth every 8 (eight) hours as needed. 10 tablet Tommie Samsook, Cattleya Dobratz G, DO     Controlled Substance Prescriptions [place] Controlled Substance Registry consulted? Yes, I have consulted the [place] Controlled Substances Registry for this patient, and feel the risk/benefit ratio today is favorable for proceeding with this prescription for a controlled substance.   Tommie SamsCook, Emilly Lavey G, [place] 06/15/19 1444

## 2019-06-15 NOTE — ED Triage Notes (Signed)
Patient c/o tooth pain that started 2 days ago.

## 2019-06-15 NOTE — Discharge Instructions (Signed)
Medications as prescribed.  Be sure to see your dentist.  Take care  Dr. Lacinda Axon

## 2020-07-29 ENCOUNTER — Ambulatory Visit
Admission: EM | Admit: 2020-07-29 | Discharge: 2020-07-29 | Disposition: A | Discharge status: Home / Self Care | Payer: Self-pay | Attending: Physician Assistant | Admitting: Physician Assistant

## 2020-07-29 ENCOUNTER — Other Ambulatory Visit: Payer: Self-pay

## 2020-07-29 DIAGNOSIS — R519 Headache, unspecified: Secondary | ICD-10-CM

## 2020-07-29 DIAGNOSIS — B0230 Zoster ocular disease, unspecified: Secondary | ICD-10-CM

## 2020-07-29 DIAGNOSIS — R238 Other skin changes: Secondary | ICD-10-CM

## 2020-07-29 MED ORDER — VALACYCLOVIR HCL 1 G PO TABS: 1000.0000 mg | ORAL_TABLET | Freq: Three times a day (TID) | ORAL | 0 refills | Status: AC

## 2020-07-29 MED ORDER — PREDNISONE 10 MG PO TABS: 30.0000 mg | ORAL_TABLET | Freq: Every day | ORAL | 0 refills | Status: AC

## 2020-07-29 NOTE — Discharge Instructions (Signed)
Your rash is consistent with shingles rash.  It is affecting your eye so it is imperative that you call your eye specialist first thing in the morning!  Begin the Valtrex and prednisone as prescribed.  You can ice the eyelid to help with swelling.  If you have any sudden acute worsening of vision, severe headache, or worsening rash you should be seen again immediately.  Again, I cannot stress how important it is that you follow-up with your eye specialist as soon as possible to ensure that your eye remains healthy.

## 2020-07-29 NOTE — ED Triage Notes (Signed)
Pt with swelling and scab to top of head. Right eye lid swelling and rash to right side of face. Sx started Thursday.

## 2020-07-29 NOTE — ED Provider Notes (Signed)
[facility]    CSN: [id number] Arrival date & time: 07/29/20  0950      History   Chief Complaint Chief Complaint  Patient presents with  . Rash  . Facial Swelling    HPI Vincent Mcfarland is a 39 y.o. male presenting for right eyelid swelling and a rash on the right side of the face x3 days.  He says the rash is blistering and tender to touch.  He also admits to a right-sided headache.  He says that he first noticed a bump that then turned into a scab of the right side of his scalp.  The next day that was followed by blistering rash.  He has started to have swelling of the right upper eyelid over the past couple of days. The eye has been watering. Admits to headaches on right side along with swollen glands of the back of his neck.  Denies any associated throat swelling, spine difficulty, chest tightness, or breathing difficulty.  No known allergies and denies any similar issues from the past.  Patient is a insulin-dependent diabetic.  He admits that he already has issues with his right eye and sees an eye specialist.  He [facility] to the [facility]. He has no other concerns today.  HPI  Past Medical History:  Diagnosis Date  . Diabetes mellitus without complication (HCC)   . Hypertension     There are no problems to display for this patient.   History reviewed. No pertinent surgical history.     Home Medications    Prior to Admission medications   Medication Sig Start Date End Date Taking? Authorizing Provider  amLODipine (NORVASC) 10 MG tablet Take 1 tablet by mouth daily 01/02/19   [provider]  amoxicillin-clavulanate (AUGMENTIN) 875-125 MG tablet Take 1 tablet by mouth every 12 (twelve) hours. 06/15/19   Tommie Sams, DO  HYDROcodone-acetaminophen (NORCO/VICODIN) 5-325 MG tablet Take 1 tablet by mouth every 8 (eight) hours as needed. 06/15/19   Tommie Sams, DO  insulin aspart (NOVOLOG) 100 unit/mL injection Inject into the  skin 3 (three) times daily before meals.    [provider]  insulin NPH Human (NOVOLIN N) 100 UNIT/ML injection Inject 30 Units under the skin 2 times daily before meals 03/30/17   [provider]  lisinopril (ZESTRIL) 20 MG tablet Take by mouth. 03/30/17   [provider]  predniSONE (DELTASONE) 10 MG tablet Take 3 tablets (30 mg total) by mouth daily for 5 days. 07/29/20 08/03/20  Shirlee Latch, PA-C  valACYclovir (VALTREX) 1000 MG tablet Take 1 tablet (1,000 mg total) by mouth 3 (three) times daily for 10 days. 07/29/20 08/08/20  Shirlee Latch, PA-C    Family History History reviewed. No pertinent family history.  Social History Social History   Tobacco Use  . Smoking status: Never Smoker  . Smokeless tobacco: Never Used  Vaping Use  . Vaping Use: Never used  Substance Use Topics  . Alcohol use: No  . Drug use: No     Allergies   Patient has no known allergies.   Review of Systems Review of Systems  Constitutional: Negative for fatigue and fever.  HENT: Positive for facial swelling. Negative for congestion and trouble swallowing.   Eyes: Positive for discharge. Negative for photophobia, pain, redness, itching and visual disturbance.  Respiratory: Negative for chest tightness, shortness of breath and wheezing.   Gastrointestinal: Negative for nausea and vomiting.  Skin: Positive  for rash.  Neurological: Positive for headaches. Negative for dizziness.  Hematological: Positive for adenopathy.     Physical Exam Triage Vital Signs ED Triage Vitals  Enc Vitals Group     BP 07/29/20 1014 (!) 153/99     Pulse Rate 07/29/20 1014 100     Resp 07/29/20 1014 17     Temp 07/29/20 1014 98.8 F (37.1 C)     Temp Source 07/29/20 1014 Oral     SpO2 07/29/20 1014 99 %     Weight 07/29/20 1013 222 lb (100.7 kg)     Height 07/29/20 1013 6\' 1"  (1.854 m)     Head Circumference --      Peak Flow --      Pain Score 07/29/20 1012 2     Pain Loc --       Pain Edu? --      Excl. in [place]? --    No data found.  Updated Vital Signs BP (!) 153/99 (BP Location: Left Arm)   Pulse 100   Temp 98.8 F (37.1 C) (Oral)   Resp 17   Ht 6\' 1"  (1.854 m)   Wt 222 lb (100.7 kg)   SpO2 99%   BMI 29.29 kg/m        Physical Exam Vitals and nursing note reviewed.  Constitutional:      General: He is not in acute distress.    Appearance: Normal appearance. He is well-developed and normal weight. He is not ill-appearing or toxic-appearing.  HENT:     Head: Normocephalic and atraumatic.     Right Ear: Tympanic membrane, ear canal and external ear normal.     Left Ear: Tympanic membrane, ear canal and external ear normal.     Nose: Nose normal. No congestion or rhinorrhea.     Mouth/Throat:     Mouth: Mucous membranes are moist.     Pharynx: Oropharynx is clear.  Eyes:     General: Vision grossly intact. No scleral icterus.       Right eye: No foreign body, discharge or hordeolum.     Conjunctiva/sclera: Conjunctivae normal.     Comments: Moderate edema and swelling of right upper and lower eyelids with mild associated erythema  Cardiovascular:     Rate and Rhythm: Normal rate and regular rhythm.     Heart sounds: Normal heart sounds.  Pulmonary:     Effort: Pulmonary effort is normal. No respiratory distress.     Breath sounds: Normal breath sounds.  Musculoskeletal:     Cervical back: Normal range of motion and neck supple.  Lymphadenopathy:     Cervical: Cervical adenopathy (right posterior) present.  Skin:    General: Skin is warm and dry.     Findings: Rash present.     Comments: There is a scabbed region of the right scalp that is tender to palpation. There is a vesicular erythematous rash affecting the right side of face in V1 and V2 distribution-does not affect eyelids or nose. Affects forehead, cheek. Rash is affecting just above upper lip. Does not cross midline at any point. Areas of rash are tender to palpation  Neurological:      General: No focal deficit present.     Mental Status: He is alert. Mental status is at baseline.     Motor: No weakness.     Gait: Gait normal.  Psychiatric:        Mood and Affect: Mood normal.  Behavior: Behavior normal.        Thought Content: Thought content normal.     Initially but indicated and receives information pain again with UC Treatments / Results  Labs (all labs ordered are listed, but only abnormal results are displayed) Labs Reviewed - No data to display  EKG   Radiology No results found.  Procedures Procedures (including critical care time)  Medications Ordered in UC Medications - No data to display  Initial Impression / Assessment and Plan / UC Course  I have reviewed the triage vital signs and the nursing notes.  Pertinent labs & imaging results that were available during my care of the patient were reviewed by me and considered in my medical decision making (see chart for details).   Rash consistent with herpes zoster affecting the V1 and V2 distribution. The eye itself does not appear to be affected and he denies eye pain. Starting on valtrex and prednisone at this time. Patient already has an eye specialist and says he does not need a referral. Advised him to call first thing tomorrow morning and let them know he likely has herpes zoster ophthalmicus. Discussed with him the importance of prompt follow up with eye specialist. To ED for any acute vision changes, dizziness, severe headaches, vomiting, etc. Patient agreeable.    Final Clinical Impressions(s) / UC Diagnoses   Final diagnoses:  Herpes zoster with ophthalmic complication, unspecified herpes zoster eye disease  Vesicular rash  Acute nonintractable headache, unspecified headache type     Discharge Instructions     Your rash is consistent with shingles rash.  It is affecting your eye so it is imperative that you call your eye specialist first thing in the morning!  Begin the Valtrex  and prednisone as prescribed.  You can ice the eyelid to help with swelling.  If you have any sudden acute worsening of vision, severe headache, or worsening rash you should be seen again immediately.  Again, I cannot stress how important it is that you follow-up with your eye specialist as soon as possible to ensure that your eye remains healthy.    ED Prescriptions    Medication Sig Dispense Auth. Provider   valACYclovir (VALTREX) 1000 MG tablet Take 1 tablet (1,000 mg total) by mouth 3 (three) times daily for 10 days. 30 tablet Eusebio Friendly B, PA-C   predniSONE (DELTASONE) 10 MG tablet Take 3 tablets (30 mg total) by mouth daily for 5 days. 15 tablet Gareth Morgan     PDMP not reviewed this encounter.   Shirlee Latch, PA-C 07/30/20 2026

## 2020-08-06 ENCOUNTER — Ambulatory Visit
Admission: EM | Admit: 2020-08-06 | Discharge: 2020-08-06 | Disposition: A | Discharge status: Home / Self Care | Payer: Self-pay | Attending: Emergency Medicine | Admitting: Emergency Medicine

## 2020-08-06 ENCOUNTER — Encounter: Payer: Self-pay | Admitting: Emergency Medicine

## 2020-08-06 ENCOUNTER — Other Ambulatory Visit: Payer: Self-pay

## 2020-08-06 DIAGNOSIS — S0096XA Insect bite (nonvenomous) of unspecified part of head, initial encounter: Secondary | ICD-10-CM

## 2020-08-06 DIAGNOSIS — W57XXXA Bitten or stung by nonvenomous insect and other nonvenomous arthropods, initial encounter: Secondary | ICD-10-CM

## 2020-08-06 MED ORDER — MUPIROCIN CALCIUM 2 % EX CREA: 1.0000 "application " | TOPICAL_CREAM | Freq: Two times a day (BID) | CUTANEOUS | 0 refills | Status: AC

## 2020-08-06 MED ORDER — MELOXICAM 7.5 MG PO TABS: 7.5000 mg | ORAL_TABLET | Freq: Two times a day (BID) | ORAL | 1 refills | Status: AC

## 2020-08-06 MED ORDER — METHOCARBAMOL 500 MG PO TABS: 500.0000 mg | ORAL_TABLET | Freq: Two times a day (BID) | ORAL | 0 refills | Status: AC

## 2020-08-06 NOTE — ED Triage Notes (Signed)
Patient in today c/o insect bite to the top of his head x 10 days. Patient was seen and treated on 07/29/20 for shingles. Patient is still taking the Valtrex prescribed at that visit.

## 2020-08-06 NOTE — Discharge Instructions (Addendum)
You can take meloxicam and Robaxin daily for right-sided neck discomfort. Apply mupirocin twice daily to scab formation on scalp.

## 2020-08-06 NOTE — ED Provider Notes (Signed)
Emergency Department Provider Note  ____________________________________________  Time seen: Approximately 1:52 PM  I have reviewed the triage vital signs and the nursing notes.   HISTORY  Chief Complaint Insect Bite (top of head)   Historian Patient     HPI Vincent Mcfarland is a 39 y.o. male with a history of diabetes and hypertension recently diagnosed shingles, presents to the emergency department for concern for a region of scabbing at right scalp.  Patient has also had some achiness and tightness along the muscles of the right posterior neck.  Denies fever and chills.  He states that he was stung by an insect prior to being diagnosed with shingles and feels like he has 2 separate issues.  He denies a history of cutaneous abscesses.  Patient is concerned that he might need an antibiotic for scabbing at scalp and some anti-inflammatories for musculoskeletal pain.   Past Medical History:  Diagnosis Date  . Diabetes mellitus without complication (HCC)   . Hypertension      Immunizations up to date:  Yes.     Past Medical History:  Diagnosis Date  . Diabetes mellitus without complication (HCC)   . Hypertension     There are no problems to display for this patient.   Past Surgical History:  Procedure Laterality Date  . EYE SURGERY      Prior to Admission medications   Medication Sig Start Date End Date Taking? Authorizing Provider  amLODipine (NORVASC) 10 MG tablet Take 1 tablet by mouth daily 01/02/19  Yes [provider]  insulin aspart (NOVOLOG) 100 unit/mL injection Inject into the skin 3 (three) times daily before meals.   Yes [provider]  insulin NPH Human (NOVOLIN N) 100 UNIT/ML injection Inject 30 Units under the skin 2 times daily before meals 03/30/17  Yes [provider]  lisinopril (ZESTRIL) 20 MG tablet Take by mouth. 03/30/17  Yes [provider]  valACYclovir (VALTREX) 1000 MG tablet Take 1 tablet (1,000 mg total) by  mouth 3 (three) times daily for 10 days. 07/29/20 08/08/20 Yes Eusebio Friendly B, PA-C  amoxicillin-clavulanate (AUGMENTIN) 875-125 MG tablet Take 1 tablet by mouth every 12 (twelve) hours. 06/15/19   Tommie Sams, DO  HYDROcodone-acetaminophen (NORCO/VICODIN) 5-325 MG tablet Take 1 tablet by mouth every 8 (eight) hours as needed. 06/15/19   Tommie Sams, DO  meloxicam (MOBIC) 7.5 MG tablet Take 1 tablet (7.5 mg total) by mouth in the morning and at bedtime for 10 days. 08/06/20 08/16/20  Orvil Feil, PA-C  methocarbamol (ROBAXIN) 500 MG tablet Take 1 tablet (500 mg total) by mouth 2 (two) times daily for 5 days. 08/06/20 08/11/20  Orvil Feil, PA-C  mupirocin cream (BACTROBAN) 2 % Apply 1 application topically 2 (two) times daily for 7 days. Apply to scabbing on scalp twice daily for the next 10 days. 08/06/20 08/13/20  Orvil Feil, PA-C    Allergies Patient has no known allergies.  Family History  Problem Relation Age of Onset  . Healthy Mother   . Aneurysm Father        brain  . Hypertension Father     Social History Social History   Tobacco Use  . Smoking status: Never Smoker  . Smokeless tobacco: Never Used  Vaping Use  . Vaping Use: Never used  Substance Use Topics  . Alcohol use: No  . Drug use: No     Review of Systems  Constitutional: No fever/chills Eyes:  No discharge ENT:  No upper respiratory complaints. Respiratory: no cough. No SOB/ use of accessory muscles to breath Gastrointestinal:   No nausea, no vomiting.  No diarrhea.  No constipation. Musculoskeletal: Negative for musculoskeletal pain. Skin: Patient has scabbing at scalp.    ____________________________________________   PHYSICAL EXAM:  VITAL SIGNS: ED Triage Vitals  Enc Vitals Group     BP 08/06/20 1222 (!) 138/99     Pulse Rate 08/06/20 1222 (!) 101     Resp 08/06/20 1222 18     Temp 08/06/20 1222 98.5 F (36.9 C)     Temp Source 08/06/20 1222 Oral     SpO2 08/06/20 1222 100 %      Weight 08/06/20 1223 215 lb (97.5 kg)     Height 08/06/20 1223 6' (1.829 m)     Head Circumference --      Peak Flow --      Pain Score 08/06/20 1222 6     Pain Loc --      Pain Edu? --      Excl. in [place]? --      Constitutional: Alert and oriented. Well appearing and in no acute distress. Eyes: Conjunctivae are normal. PERRL. EOMI. Head: Atraumatic.Marland Kitchen Patient has a small region of scabbing at right parietal scalp.  No flaccid vesicles from shingles visualized.  No surrounding cellulitis.  No palpable induration or fluctuance.  No regions of hair loss.  No scaling of the skin. Neck: No stridor.  Full range of motion at the neck.  No midline C-spine tenderness to palpation. Cardiovascular: Normal rate, regular rhythm. Normal S1 and S2.  Good peripheral circulation. Respiratory: Normal respiratory effort without tachypnea or retractions. Lungs CTAB. Good air entry to the bases with no decreased or absent breath sounds Gastrointestinal: Bowel sounds x 4 quadrants. Soft and nontender to palpation. No guarding or rigidity. No distention. Musculoskeletal: Full range of motion to all extremities. No obvious deformities noted Neurologic:  Normal for age. No gross focal neurologic deficits are appreciated.  Skin:  Skin is warm, dry and intact. No rash noted. Psychiatric: Mood and affect are normal for age. Speech and behavior are normal.   ____________________________________________   LABS (all labs ordered are listed, but only abnormal results are displayed)  Labs Reviewed - No data to display ____________________________________________  EKG   ____________________________________________  RADIOLOGY   No results found.  ____________________________________________    PROCEDURES  Procedure(s) performed:     Procedures     Medications - No data to display   ____________________________________________   INITIAL IMPRESSION / ASSESSMENT AND PLAN / ED  COURSE  Pertinent labs & imaging results that were available during my care of the patient were reviewed by me and considered in my medical decision making (see chart for details).      Assessment and plan Insect bite Musculoskeletal pain 39 year old male presents to the emergency department with a region of scabbing along right parietal scalp.  There is no surrounding cellulitis.  No appreciable fluctuance or induration to palpation.  Will treat patient with mupirocin twice daily for the next 7 days to cover him for potential secondary staph infection although my suspicion for secondary staph infection is low.  Patient will take meloxicam and Robaxin for right-sided neck discomfort.  Return precautions were given to return with new or worsening symptoms.    ____________________________________________  FINAL CLINICAL IMPRESSION(S) / ED DIAGNOSES  Final diagnoses:  Insect bite of head, unspecified part, initial encounter      NEW MEDICATIONS  STARTED DURING THIS VISIT:  ED Discharge Orders         Ordered    mupirocin cream (BACTROBAN) 2 %  2 times daily        08/06/20 1349    methocarbamol (ROBAXIN) 500 MG tablet  2 times daily        08/06/20 1349    meloxicam (MOBIC) 7.5 MG tablet  2 times daily        08/06/20 1349              This chart was dictated using voice recognition software/Dragon. Despite best efforts to proofread, errors can occur which can change the meaning. Any change was purely unintentional.     Orvil Feil, PA-C 08/06/20 1358

## 2020-11-13 ENCOUNTER — Other Ambulatory Visit: Payer: Self-pay

## 2020-11-13 ENCOUNTER — Encounter (INDEPENDENT_AMBULATORY_CARE_PROVIDER_SITE_OTHER): Payer: Self-pay | Admitting: Primary Care

## 2020-11-13 ENCOUNTER — Ambulatory Visit (INDEPENDENT_AMBULATORY_CARE_PROVIDER_SITE_OTHER): Payer: Self-pay | Admitting: Primary Care

## 2020-11-13 ENCOUNTER — Ambulatory Visit (INDEPENDENT_AMBULATORY_CARE_PROVIDER_SITE_OTHER): Payer: BLUE CROSS/BLUE SHIELD | Admitting: Primary Care

## 2020-11-13 VITALS — BP 148/88 | HR 108 | Resp 16 | Ht 72.0 in | Wt 220.0 lb

## 2020-11-13 DIAGNOSIS — E119 Type 2 diabetes mellitus without complications: Secondary | ICD-10-CM

## 2020-11-13 DIAGNOSIS — Z13220 Encounter for screening for lipoid disorders: Secondary | ICD-10-CM | POA: Diagnosis not present

## 2020-11-13 DIAGNOSIS — Z7689 Persons encountering health services in other specified circumstances: Secondary | ICD-10-CM

## 2020-11-13 DIAGNOSIS — K029 Dental caries, unspecified: Secondary | ICD-10-CM | POA: Diagnosis not present

## 2020-11-13 DIAGNOSIS — Z794 Long term (current) use of insulin: Secondary | ICD-10-CM | POA: Diagnosis not present

## 2020-11-13 LAB — GLUCOSE, POCT (MANUAL RESULT ENTRY): POC Glucose: 157 mg/dl — AB (ref 70–99)

## 2020-11-13 LAB — POCT GLYCOSYLATED HEMOGLOBIN (HGB A1C): HbA1c, POC (controlled diabetic range): 9.2 % — AB (ref 0.0–7.0)

## 2020-11-13 NOTE — Progress Notes (Signed)
New Patient Office Visit  Subjective:  Patient ID: Vincent Mcfarland, male    DOB: 1981-02-06  Age: 40 y.o. MRN: [id number]  CC: No chief complaint on file.   HPI [name] is a 40 year old male who presents for establishment of care and management of type 1 diabetes.  Compliant with meds - Yes, Checking CBGs? Yes, Fasting avg - 70-180, Exercising regularly? - Yes, Watching carbohydrate intake? - Yes, Neuropathy ? - Yes Hypoglycemic events - No  Pertinent ROS:  Polyuria - No Polydipsia - No Vision problems - No  Medications as noted below. Taking them regularly without complication/adverse reaction being reported today.  Current Outpatient Medications on File Prior to Visit  Medication Sig Dispense Refill  . amLODipine (NORVASC) 10 MG tablet Take 1 tablet by mouth daily    . insulin aspart (NOVOLOG) 100 unit/mL injection Inject into the skin 3 (three) times daily before meals.    . insulin NPH Human (NOVOLIN N) 100 UNIT/ML injection Inject 25 Units into the skin 2 (two) times daily before a meal.    . insulin regular (NOVOLIN R) 100 units/mL injection Inject 10 Units into the skin 2 (two) times daily before a meal.    . lisinopril (ZESTRIL) 20 MG tablet Take by mouth.     No current facility-administered medications on file prior to visit.     Past Medical History:  Diagnosis Date  . Diabetes mellitus without complication ([place])   . Hypertension     Past Surgical History:  Procedure Laterality Date  . EYE SURGERY      Family History  Problem Relation Age of Onset  . Healthy Mother   . Aneurysm Father        brain  . Hypertension Father     Social History   Socioeconomic History  . Marital status: Single    Spouse name: Not on file  . Number of children: Not on file  . Years of education: Not on file  . Highest education level: Not on file  Occupational History  . Not on file  Tobacco Use  . Smoking status: Never Smoker  . Smokeless tobacco: Never  Used  Vaping Use  . Vaping Use: Never used  Substance and Sexual Activity  . Alcohol use: No  . Drug use: No  . Sexual activity: Not on file  Other Topics Concern  . Not on file  Social History Narrative  . Not on file   Social Determinants of Health   Financial Resource Strain: Not on file  Food Insecurity: Not on file  Transportation Needs: Not on file  Physical Activity: Not on file  Stress: Not on file  Social Connections: Not on file  Intimate Partner Violence: Not on file    ROS Review of Systems  Neurological: Positive for headaches.  All other systems reviewed and are negative.   Objective:   Today's Vitals: BP (!) 148/88 (Patient Position: Sitting)   Pulse (!) 108   Resp 16   Ht 6' (1.829 m)   Wt 220 lb (99.8 kg)   SpO2 98%   BMI 29.84 kg/m   Physical Exam Vitals:   11/13/20 1549 11/13/20 1619  BP: (!) 152/100 (!) 148/88  Pulse: (!) 108   Resp: 16   SpO2: 98%   Weight: 220 lb (99.8 kg)   Height: 6' (1.829 m)    General: Vital signs reviewed.  Patient is well-developed and well-nourished, in no acute distress and cooperative with  exam.  Head: Normocephalic and atraumatic. Eyes: EOMI, conjunctivae normal, no scleral icterus.  Neck: Supple, trachea midline, normal ROM, no JVD, masses, thyromegaly, or carotid bruit present.  Cardiovascular: RRR, S1 normal, S2 normal, no murmurs, gallops, or rubs. Pulmonary/Chest: Clear to auscultation bilaterally, no wheezes, rales, or rhonchi. Abdominal: Soft, non-tender, non-distended, BS +, no masses, organomegaly, or guarding present.  Musculoskeletal: No joint deformities, erythema, or stiffness, ROM full and nontender. Extremities: No lower extremity edema bilaterally,  pulses symmetric and intact bilaterally. No cyanosis or clubbing. Neurological: A&O x3, Strength is normal and symmetric bilaterally, no focal motor deficit,  Skin: Warm, dry and intact. No rashes or erythema. Psychiatric: Normal mood and affect.  speech and behavior is normal. Cognition and memory are normal.  Assessment & Plan:  Diagnoses and all orders for this visit:  Encounter to establish care Establish care with new PCP  Type 2 diabetes mellitus without complication, with long-term current use of insulin (HCC) -     HgB A1c 9.2 uncontrol  Counseled on diabetic retinopathy leading to blindness, diabetic nephropathy leading to dialysis, decrease in circulation decrease in sores or wound healing which may lead to amputations and increase of heart attack and stroke -     Glucose (CBG) -     CBC with Differential/Platelet; Future -     CMP14+EGFR; Future  Dental caries Refer to dental clinic   Lipid screening -     Lipid panel; Future    Outpatient Encounter Medications as of 11/13/2020  Medication Sig  . amLODipine (NORVASC) 10 MG tablet Take 1 tablet by mouth daily  . insulin aspart (NOVOLOG) 100 unit/mL injection Inject into the skin 3 (three) times daily before meals.  . insulin NPH Human (NOVOLIN N) 100 UNIT/ML injection Inject 25 Units into the skin 2 (two) times daily before a meal.  . insulin regular (NOVOLIN R) 100 units/mL injection Inject 10 Units into the skin 2 (two) times daily before a meal.  . lisinopril (ZESTRIL) 20 MG tablet Take by mouth.  . [DISCONTINUED] amoxicillin-clavulanate (AUGMENTIN) 875-125 MG tablet Take 1 tablet by mouth every 12 (twelve) hours.  . [DISCONTINUED] HYDROcodone-acetaminophen (NORCO/VICODIN) 5-325 MG tablet Take 1 tablet by mouth every 8 (eight) hours as needed.  . [DISCONTINUED] insulin NPH Human (NOVOLIN N) 100 UNIT/ML injection Inject 30 Units under the skin 2 times daily before meals   No facility-administered encounter medications on file as of 11/13/2020.    Follow-up: Return today (on 11/13/2020) for BP follow up.   Kerin Perna, NP

## 2020-11-13 NOTE — Patient Instructions (Signed)
Type 1 Diabetes Mellitus, Self-Care, Adult When you have type 1 diabetes (type 1 diabetes mellitus), you must make sure your blood sugar (glucose) stays in a healthy range. You can do this with:  Insulin.  Healthy foods.  Exercise.  Lifestyle changes.  Other medicines, if needed.  Support from doctors and others. What are the risks? Having diabetes can put you at risk for heart disease and kidney disease. These problems can get worse if you do not take good care of yourself and keep your blood sugar in a healthy range. How to stay aware of blood sugar  Check your blood sugar every day, as often as told.  Have your A1C (hemoglobin A1C) level checked two or more times a year. Have it checked more often if your doctor tells you to.  Try to meet the treatment goals set by your doctor. Try to have these blood sugar levels: ? Before meals (preprandial): 80-130 mg/dL (4.4-7.2 mmol/L). ? After meals (postprandial): below 180 mg/dL (10 mmol/L). ? A1C level: less than 7%.   Follow these instructions at home: Medicines  Take over-the-counter and prescription medicines only as told by your doctor.  Take insulin and medicines every day as told.  Do not run out of insulin or medicines.  Change the amount of insulin you take based on how active you are and what foods you eat. Your doctor will tell you the amount to take. Eating and drinking  Choose healthy foods, such as: ? Low-fat (lean) proteins. ? Complex carbs (carbohydrates), such aswhole wheat. ? Fresh fruits and vegetables. ? Low-fat dairy products. ? Healthy fats.  Meet with a food expert (dietitian) to help you make an eating plan that is right for you. Be sure to: ? Follow instructions from your doctor about what you cannot eat or drink. ? Drink enough fluid to keep your pee (urine) pale yellow. ? Keep track of carbs that you eat. Read food labels and learn food serving sizes. ? Follow your sick-day plan when you cannot  eat or drink normally. Make this plan with your doctor so it is ready.  Have a 15-gram fast-acting carb snack with you at all times to treat low blood sugar.   Activity  Get regular exercise as told.  Spread out your exercise over 3 or more days a week.  Do not go more than 2 days in a row without exercise.  Talk with your doctor before you start a new exercise. Lifestyle  Do not use any products that contain nicotine or tobacco, such as cigarettes, e-cigarettes, and chewing tobacco. If you need help quitting, ask your doctor.  If your doctor says that alcohol is safe for you: ? Limit how much you use to:  0-1 drink a day for women who are not pregnant.  0-2 drinks a day for men. ? Be aware of how much alcohol is in your drink. In the [place] one drink equals one 12 oz bottle of beer (355 mL), one 5 oz glass of wine (148 mL), or one 1 oz glass of hard liquor (44 mL).  Learn to lower stress. If you need help, ask your doctor. Body care  Stay up to date with your shots (vaccinations) as told by your doctor. Get shots for: ? The flu. ? Pneumonia. ? Hepatitis B.  Have your eyes and feet checked by a doctor as often as told.  Check your skin and feet every day. Check for cuts, bruises, redness, blisters, or sores.    Brush your teeth and gums two times a day. Floss one or more times a day.  Go to the dentist one or more times every 6 months.  Stay at a healthy weight.   General instructions  Share your diabetes care plan with people at work, school, and home.  Make sure your family and close friends learn the symptoms of low blood sugar and can help treat you. You will need their help if you cannot treat yourself.  Check your pee for ketones when you are sick and as told.  Carry a card or wear jewelry that says you have diabetes.  Keep all follow-up visits as told by your doctor. This is important. Questions to ask your doctor  Do I need to meet with a diabetes  educator?  Where can I find a support group for people with diabetes?  Should I have a glucagon shot kit at home? Where to find more information  American Diabetes Association (ADA): diabetes.org  Association of Diabetes Care and Education Specialists (ADCES): diabeteseducator.org  International Diabetes Federation (IDF): https://www.munoz-bell.org/ Get help right away if:  Your blood sugar is below 54 mg/dL (3 mmol/L).  You have medium or large levels of ketones in your pee. These symptoms may be an emergency. Do not wait to see if the symptoms will go away. Get medical help right away. Call your local emergency services (911 in the [place] Do not drive yourself to the hospital. Summary  When you have type 1 diabetes, you must make sure your blood sugar (glucose) stays in a healthy range.  Diabetes can raise your risk for heart disease and kidney disease. These problems can get worse if you do not take good care of yourself and keep your blood sugar in a healthy range.  Check your blood sugar every day, as often as told.  Keep all follow-up visits as told by your doctor. This is important. This information is not intended to replace advice given to you by your health care provider. Make sure you discuss any questions you have with your health care provider. Document Revised: 12/01/2019 Document Reviewed: 12/01/2019 Elsevier Patient Education  2021 [employer].

## 2020-11-13 NOTE — Progress Notes (Signed)
Patient states he takes insulin Dealing with headaches intermittently. He was out of medication for BP since New Year's Eve.   Novolin N- 25 BID              R- 10 HS BID

## 2020-11-14 ENCOUNTER — Other Ambulatory Visit (INDEPENDENT_AMBULATORY_CARE_PROVIDER_SITE_OTHER): Payer: BLUE CROSS/BLUE SHIELD

## 2020-11-14 DIAGNOSIS — Z13220 Encounter for screening for lipoid disorders: Secondary | ICD-10-CM

## 2020-11-14 DIAGNOSIS — E119 Type 2 diabetes mellitus without complications: Secondary | ICD-10-CM | POA: Diagnosis not present

## 2020-11-14 DIAGNOSIS — Z794 Long term (current) use of insulin: Secondary | ICD-10-CM | POA: Diagnosis not present

## 2020-11-15 LAB — LIPID PANEL
Chol/HDL Ratio: 4.4 ratio (ref 0.0–5.0)
Cholesterol, Total: 162 mg/dL (ref 100–199)
HDL: 37 mg/dL — ABNORMAL LOW (ref >39)
LDL Chol Calc (NIH): 104 mg/dL — ABNORMAL HIGH (ref 0–99)
Triglycerides: 112 mg/dL (ref 0–149)
VLDL Cholesterol Cal: 21 mg/dL (ref 5–40)

## 2020-11-15 LAB — CMP14+EGFR
ALT: 23 IU/L (ref 0–44)
AST: 29 IU/L (ref 0–40)
Albumin/Globulin Ratio: 1.4 (ref 1.2–2.2)
Albumin: 4.3 g/dL (ref 4.0–5.0)
Alkaline Phosphatase: 69 IU/L (ref 44–121)
BUN/Creatinine Ratio: 17 (ref 9–20)
BUN: 24 mg/dL (ref 6–24)
Bilirubin Total: 0.4 mg/dL (ref 0.0–1.2)
CO2: 24 mmol/L (ref 20–29)
Calcium: 9.4 mg/dL (ref 8.7–10.2)
Chloride: 101 mmol/L (ref 96–106)
Creatinine, Ser: 1.39 mg/dL — ABNORMAL HIGH (ref 0.76–1.27)
GFR calc Af Amer: 72 mL/min/{1.73_m2} (ref >59)
GFR calc non Af Amer: 63 mL/min/{1.73_m2} (ref >59)
Globulin, Total: 3 g/dL (ref 1.5–4.5)
Glucose: 80 mg/dL (ref 65–99)
Potassium: 4.5 mmol/L (ref 3.5–5.2)
Sodium: 138 mmol/L (ref 134–144)
Total Protein: 7.3 g/dL (ref 6.0–8.5)

## 2020-11-15 LAB — CBC WITH DIFFERENTIAL/PLATELET
Basophils Absolute: 0 10*3/uL (ref 0.0–0.2)
Basos: 0 %
EOS (ABSOLUTE): 0.1 10*3/uL (ref 0.0–0.4)
Eos: 1 %
Hematocrit: 41 % (ref 37.5–51.0)
Hemoglobin: 13.6 g/dL (ref 13.0–17.7)
Immature Grans (Abs): 0 10*3/uL (ref 0.0–0.1)
Immature Granulocytes: 0 %
Lymphocytes Absolute: 2.1 10*3/uL (ref 0.7–3.1)
Lymphs: 26 %
MCH: 29.8 pg (ref 26.6–33.0)
MCHC: 33.2 g/dL (ref 31.5–35.7)
MCV: 90 fL (ref 79–97)
Monocytes Absolute: 0.4 10*3/uL (ref 0.1–0.9)
Monocytes: 5 %
Neutrophils Absolute: 5.5 10*3/uL (ref 1.4–7.0)
Neutrophils: 68 %
Platelets: 328 10*3/uL (ref 150–450)
RBC: 4.56 x10E6/uL (ref 4.14–5.80)
RDW: 12.7 % (ref 11.6–15.4)
WBC: 8.1 10*3/uL (ref 3.4–10.8)

## 2020-11-23 ENCOUNTER — Telehealth (INDEPENDENT_AMBULATORY_CARE_PROVIDER_SITE_OTHER): Payer: Self-pay

## 2020-11-23 ENCOUNTER — Telehealth (INDEPENDENT_AMBULATORY_CARE_PROVIDER_SITE_OTHER): Payer: Self-pay | Admitting: Primary Care

## 2020-11-23 NOTE — Telephone Encounter (Signed)
Patient is aware that labs are normal. Advised to drink more water. Also advised to decrease or stop use of ibuprofen, advil/naproxen. He verbalized understanding. Maryjean Morn, CMA

## 2020-11-23 NOTE — Telephone Encounter (Signed)
-----   Message from Grayce Sessions, NP sent at 11/22/2020  1:38 PM EST ----- Labs are normal except creatinine which is a indicator of kidney function. Increase water , decrease or stop any ibuprofen , Advil or naproxen.

## 2020-11-23 NOTE — Telephone Encounter (Signed)
Patient is calling for lab results. Please advise cb- [phone]

## 2020-11-26 ENCOUNTER — Other Ambulatory Visit: Payer: Self-pay

## 2020-11-26 ENCOUNTER — Other Ambulatory Visit: Payer: BLUE CROSS/BLUE SHIELD

## 2020-11-26 DIAGNOSIS — Z20822 Contact with and (suspected) exposure to covid-19: Secondary | ICD-10-CM | POA: Diagnosis not present

## 2020-11-27 ENCOUNTER — Ambulatory Visit (INDEPENDENT_AMBULATORY_CARE_PROVIDER_SITE_OTHER): Payer: Self-pay | Admitting: *Deleted

## 2020-11-27 NOTE — Telephone Encounter (Signed)
Patient requesting covid test results. Results still pending at this time. Patient verbalized understanding and would be notified only if positive.

## 2020-11-28 LAB — NOVEL CORONAVIRUS, NAA: SARS-CoV-2, NAA: NOT DETECTED

## 2020-11-28 LAB — SARS-COV-2, NAA 2 DAY TAT

## 2020-11-29 ENCOUNTER — Telehealth: Payer: Self-pay | Admitting: *Deleted

## 2020-11-29 NOTE — Telephone Encounter (Signed)
Pt notified of negative COVID-19 results. Understanding verbalized.   

## 2020-11-30 ENCOUNTER — Inpatient Hospital Stay: Payer: BLUE CROSS/BLUE SHIELD

## 2020-11-30 ENCOUNTER — Inpatient Hospital Stay
Admission: EM | Admit: 2020-11-30 | Discharge: 2020-12-02 | DRG: 178 | Disposition: A | Discharge status: Home / Self Care | Payer: BLUE CROSS/BLUE SHIELD | Attending: Family Medicine | Admitting: Family Medicine

## 2020-11-30 ENCOUNTER — Other Ambulatory Visit: Payer: Self-pay

## 2020-11-30 ENCOUNTER — Encounter: Payer: Self-pay | Admitting: Intensive Care

## 2020-11-30 DIAGNOSIS — I10 Essential (primary) hypertension: Secondary | ICD-10-CM | POA: Diagnosis present

## 2020-11-30 DIAGNOSIS — U071 COVID-19: Principal | ICD-10-CM | POA: Diagnosis present

## 2020-11-30 DIAGNOSIS — K2900 Acute gastritis without bleeding: Secondary | ICD-10-CM | POA: Diagnosis present

## 2020-11-30 DIAGNOSIS — Z794 Long term (current) use of insulin: Secondary | ICD-10-CM

## 2020-11-30 DIAGNOSIS — E1165 Type 2 diabetes mellitus with hyperglycemia: Secondary | ICD-10-CM | POA: Diagnosis present

## 2020-11-30 DIAGNOSIS — N182 Chronic kidney disease, stage 2 (mild): Secondary | ICD-10-CM | POA: Diagnosis not present

## 2020-11-30 DIAGNOSIS — E871 Hypo-osmolality and hyponatremia: Secondary | ICD-10-CM | POA: Diagnosis present

## 2020-11-30 DIAGNOSIS — E861 Hypovolemia: Secondary | ICD-10-CM | POA: Diagnosis present

## 2020-11-30 DIAGNOSIS — Z79899 Other long term (current) drug therapy: Secondary | ICD-10-CM

## 2020-11-30 DIAGNOSIS — R739 Hyperglycemia, unspecified: Secondary | ICD-10-CM | POA: Diagnosis not present

## 2020-11-30 DIAGNOSIS — Y92009 Unspecified place in unspecified non-institutional (private) residence as the place of occurrence of the external cause: Secondary | ICD-10-CM | POA: Diagnosis not present

## 2020-11-30 DIAGNOSIS — T39395A Adverse effect of other nonsteroidal anti-inflammatory drugs [NSAID], initial encounter: Secondary | ICD-10-CM | POA: Diagnosis present

## 2020-11-30 DIAGNOSIS — R112 Nausea with vomiting, unspecified: Secondary | ICD-10-CM

## 2020-11-30 DIAGNOSIS — Z8249 Family history of ischemic heart disease and other diseases of the circulatory system: Secondary | ICD-10-CM | POA: Diagnosis not present

## 2020-11-30 DIAGNOSIS — E1122 Type 2 diabetes mellitus with diabetic chronic kidney disease: Secondary | ICD-10-CM | POA: Diagnosis present

## 2020-11-30 DIAGNOSIS — N179 Acute kidney failure, unspecified: Secondary | ICD-10-CM | POA: Diagnosis not present

## 2020-11-30 DIAGNOSIS — E860 Dehydration: Secondary | ICD-10-CM | POA: Diagnosis present

## 2020-11-30 DIAGNOSIS — I129 Hypertensive chronic kidney disease with stage 1 through stage 4 chronic kidney disease, or unspecified chronic kidney disease: Secondary | ICD-10-CM | POA: Diagnosis not present

## 2020-11-30 LAB — CBC
HCT: 41.6 % (ref 39.0–52.0)
Hemoglobin: 14.4 g/dL (ref 13.0–17.0)
MCH: 30.1 pg (ref 26.0–34.0)
MCHC: 34.6 g/dL (ref 30.0–36.0)
MCV: 86.8 fL (ref 80.0–100.0)
Platelets: 213 10*3/uL (ref 150–400)
RBC: 4.79 MIL/uL (ref 4.22–5.81)
RDW: 11.8 % (ref 11.5–15.5)
WBC: 7.7 10*3/uL (ref 4.0–10.5)
nRBC: 0 % (ref 0.0–0.2)

## 2020-11-30 LAB — URINALYSIS, COMPLETE (UACMP) WITH MICROSCOPIC
Bilirubin Urine: NEGATIVE
Glucose, UA: >=500 mg/dL — AB
Ketones, ur: 5 mg/dL — AB
Leukocytes,Ua: NEGATIVE
Nitrite: NEGATIVE
Protein, ur: >=300 mg/dL — AB
Specific Gravity, Urine: 1.018 (ref 1.005–1.030)
pH: 5 (ref 5.0–8.0)

## 2020-11-30 LAB — HEPATIC FUNCTION PANEL
ALT: 23 U/L (ref 0–44)
AST: 40 U/L (ref 15–41)
Albumin: 2.9 g/dL — ABNORMAL LOW (ref 3.5–5.0)
Alkaline Phosphatase: 55 U/L (ref 38–126)
Bilirubin, Direct: 0.1 mg/dL (ref 0.0–0.2)
Indirect Bilirubin: 0.7 mg/dL (ref 0.3–0.9)
Total Bilirubin: 0.8 mg/dL (ref 0.3–1.2)
Total Protein: 6.5 g/dL (ref 6.5–8.1)

## 2020-11-30 LAB — HEMOGLOBIN A1C
Hgb A1c MFr Bld: 9.8 % — ABNORMAL HIGH (ref 4.8–5.6)
Mean Plasma Glucose: 234.56 mg/dL

## 2020-11-30 LAB — HIV ANTIBODY (ROUTINE TESTING W REFLEX): HIV Screen 4th Generation wRfx: NONREACTIVE

## 2020-11-30 LAB — BASIC METABOLIC PANEL
Anion gap: 14 (ref 5–15)
BUN: 79 mg/dL — ABNORMAL HIGH (ref 6–20)
CO2: 22 mmol/L (ref 22–32)
Calcium: 8 mg/dL — ABNORMAL LOW (ref 8.9–10.3)
Chloride: 93 mmol/L — ABNORMAL LOW (ref 98–111)
Creatinine, Ser: 3.74 mg/dL — ABNORMAL HIGH (ref 0.61–1.24)
GFR, Estimated: 20 mL/min — ABNORMAL LOW (ref >60)
Glucose, Bld: 433 mg/dL — ABNORMAL HIGH (ref 70–99)
Potassium: 4.6 mmol/L (ref 3.5–5.1)
Sodium: 129 mmol/L — ABNORMAL LOW (ref 135–145)

## 2020-11-30 LAB — SARS CORONAVIRUS 2 BY RT PCR (HOSPITAL ORDER, PERFORMED IN ~~LOC~~ HOSPITAL LAB): SARS Coronavirus 2: POSITIVE — AB

## 2020-11-30 LAB — CBG MONITORING, ED
Glucose-Capillary: 361 mg/dL — ABNORMAL HIGH (ref 70–99)
Glucose-Capillary: 333 mg/dL — ABNORMAL HIGH (ref 70–99)

## 2020-11-30 LAB — LIPASE, BLOOD: Lipase: 33 U/L (ref 11–51)

## 2020-11-30 LAB — GLUCOSE, CAPILLARY: Glucose-Capillary: 147 mg/dL — ABNORMAL HIGH (ref 70–99)

## 2020-11-30 MED ORDER — METOCLOPRAMIDE HCL 5 MG/ML IJ SOLN
10.0000 mg | Freq: Four times a day (QID) | INTRAMUSCULAR | Status: AC
  Administered 2020-11-30 (×3): 10 mg via INTRAVENOUS
  Filled 2020-11-30 (×3): qty 2

## 2020-11-30 MED ORDER — INSULIN ASPART 100 UNIT/ML ~~LOC~~ SOLN
0.0000 [IU] | Freq: Three times a day (TID) | SUBCUTANEOUS | Status: DC
  Administered 2020-11-30: 11 [IU] via SUBCUTANEOUS
  Administered 2020-12-01: 8 [IU] via SUBCUTANEOUS
  Administered 2020-12-01 (×2): 3 [IU] via SUBCUTANEOUS
  Administered 2020-12-02: 12:00:00 5 [IU] via SUBCUTANEOUS
  Filled 2020-11-30 (×6): qty 1

## 2020-11-30 MED ORDER — ONDANSETRON 4 MG PO TBDP
4.0000 mg | ORAL_TABLET | Freq: Once | ORAL | Status: AC | PRN
  Administered 2020-11-30: 4 mg via ORAL
  Filled 2020-11-30: qty 1

## 2020-11-30 MED ORDER — SODIUM CHLORIDE 0.9 % IV SOLN: INTRAVENOUS | Status: DC

## 2020-11-30 MED ORDER — ONDANSETRON 4 MG PO TBDP
4.0000 mg | ORAL_TABLET | Freq: Three times a day (TID) | ORAL | Status: DC | PRN
  Administered 2020-12-01: 4 mg via ORAL
  Filled 2020-11-30 (×2): qty 1

## 2020-11-30 MED ORDER — INSULIN ASPART 100 UNIT/ML ~~LOC~~ SOLN: 0.0000 [IU] | SUBCUTANEOUS | Status: DC

## 2020-11-30 MED ORDER — LACTATED RINGERS IV BOLUS
1000.0000 mL | Freq: Once | INTRAVENOUS | Status: AC
  Administered 2020-11-30: 1000 mL via INTRAVENOUS

## 2020-11-30 MED ORDER — AMLODIPINE BESYLATE 5 MG PO TABS: 10.0000 mg | ORAL_TABLET | Freq: Every day | ORAL | Status: DC

## 2020-11-30 MED ORDER — SODIUM CHLORIDE 0.9 % IV SOLN: Freq: Once | INTRAVENOUS | Status: AC

## 2020-11-30 MED ORDER — ENOXAPARIN SODIUM 30 MG/0.3ML ~~LOC~~ SOLN
30.0000 mg | SUBCUTANEOUS | Status: DC
  Administered 2020-11-30: 30 mg via SUBCUTANEOUS
  Filled 2020-11-30 (×2): qty 0.3

## 2020-11-30 MED ORDER — PANTOPRAZOLE SODIUM 40 MG IV SOLR
40.0000 mg | INTRAVENOUS | Status: DC
  Administered 2020-11-30 – 2020-12-01 (×2): 40 mg via INTRAVENOUS
  Filled 2020-11-30 (×2): qty 40

## 2020-11-30 NOTE — Progress Notes (Signed)
Patient in room via stretcher. Ambulated to bed without assist. Patient alert and answers questions appropriately. VSS. No c/o pain. Says nausea has decreased significantly. Blood glucose taken- 147. Updated on plan of care. Request for diluted apple juice and diet sprite. Orientated to room and call bell. Placed belongings and call bell within reach. Patient is currently on telemetry monitoring and has NS infusing at 150cc/hr to right [facility].

## 2020-11-30 NOTE — H&P (Addendum)
History and Physical    Vincent Mcfarland GNF:[id number] DOB: 1980-12-02 DOA: 11/30/2020  PCP: Grayce Sessions, NP   Patient coming from: Home  I have personally briefly reviewed patient's old medical records in [facility]  Chief Complaint: Nausea/vomiting                                Weakness  HPI: Vincent Mcfarland is a 40 y.o. male with medical history significant for insulin-dependent diabetes mellitus and hypertension who presents to the ER for evaluation of over weeks history of nausea, vomiting and inability to keep any food down.  Patient states he started having symptoms on 11/20/29 which initially included fever, cough and congestion.  He subsequently developed nausea and vomiting but denies having any diarrhea.  His wife and son recently tested positive for the COVID-19 virus on 11/26/20.  Patient had a negative PCR test on that day.  He has been taking NSAIDs at home for fever. He complains of generalized weakness and fatigue but denies having any chest pain, no palpitations, no dizziness, no diaphoresis, no headache, no shortness of breath, no urinary frequency, nocturia, dysuria, no blurred vision, no palpitations, no diaphoresis, no focal deficits. Labs show sodium 129, potassium 4.6, chloride 93, bicarb 22, glucose 133, BUN 79 compared to baseline of 24, creatinine 3.74 compared to baseline of 1.39, alkaline phosphatase 55, albumin 2.9, lipase 33, AST 40, ALT 23, total protein 6.5, total bilirubin 1.8, white count 7.7, hemoglobin 14.4, hematocrit 41.6, MCV 86.9, RDW 11.8, platelet count 213 SARS coronavirus 2 PCR test is pending 12 Lead EKG shows normal sinus rhythm   ED Course: 40 year old African-American male with a history of insulin-dependent diabetes mellitus and hypertension who presents to the ER for evaluation of weakness, nausea and vomiting.  Patient has been sick for over a week and has been exposed to his wife and son who tested positive for the COVID-19 virus.  He  had a negative COVID-19 PCR test about 4 days ago and repeat testing is still pending at this time.  Labs show hyperglycemia but worsening renal function.  At baseline patient has a serum creatinine of 1.39.3 on admission is 3.74, his BUN is also elevated at 79.  Patient has no evidence of metabolic acidosis.  He will be admitted to the hospital for further evaluation.  Review of Systems: As per HPI otherwise all systems reviewed and negative.    Past Medical History:  Diagnosis Date  . Diabetes mellitus without complication (HCC)   . Hypertension     Past Surgical History:  Procedure Laterality Date  . EYE SURGERY       reports that he has never smoked. He has never used smokeless tobacco. He reports that he does not drink alcohol and does not use drugs.  No Known Allergies  Family History  Problem Relation Age of Onset  . Healthy Mother   . Aneurysm Father        brain  . Hypertension Father      Prior to Admission medications   Medication Sig Start Date End Date Taking? Authorizing Provider  amLODipine (NORVASC) 10 MG tablet Take 1 tablet by mouth daily 01/02/19  Yes [provider]  insulin NPH Human (NOVOLIN N) 100 UNIT/ML injection Inject 25 Units into the skin 2 (two) times daily before a meal.   Yes [provider]  insulin regular (NOVOLIN R) 100 units/mL injection Inject 15  Units into the skin 2 (two) times daily before a meal.   Yes [provider]  lisinopril-hydrochlorothiazide (ZESTORETIC) 20-25 MG tablet Take 1 tablet by mouth daily. 11/01/20  Yes [provider]    Physical Exam: Vitals:   11/30/20 1158 11/30/20 1159  BP: 119/90   Pulse: 92   Resp: 16   Temp: 98.5 F (36.9 C)   TempSrc: Oral   SpO2: 97%   Weight:  97.5 kg  Height:  6' (1.829 m)     Vitals:   11/30/20 1158 11/30/20 1159  BP: 119/90   Pulse: 92   Resp: 16   Temp: 98.5 F (36.9 C)   TempSrc: Oral   SpO2: 97%   Weight:  97.5 kg  Height:  6'  (1.829 m)    Constitutional: NAD, alert and oriented x 3.  Acutely ill-appearing Eyes: PERRL, lids and conjunctivae normal ENMT: Mucous membranes are dry.  Neck: normal, supple, no masses, no thyromegaly Respiratory: clear to auscultation bilaterally, no wheezing, no crackles. Normal respiratory effort. No accessory muscle use.  Cardiovascular: Regular rate and rhythm, no murmurs / rubs / gallops. No extremity edema. 2+ pedal pulses. No carotid bruits.  Abdomen: no tenderness, no masses palpated. No hepatosplenomegaly. Bowel sounds positive.  Musculoskeletal: no clubbing / cyanosis. No joint deformity upper and lower extremities.  Skin: no rashes, lesions, ulcers.  Neurologic: No gross focal neurologic deficit.  Generalized weakness Psychiatric: Normal mood and affect.   Labs on Admission: I have personally reviewed following labs and imaging studies  CBC: Recent Labs  Lab 11/30/20 1208  WBC 7.7  HGB 14.4  HCT 41.6  MCV 86.8  PLT 213   Basic Metabolic Panel: Recent Labs  Lab 11/30/20 1208  NA 129*  K 4.6  CL 93*  CO2 22  GLUCOSE 433*  BUN 79*  CREATININE 3.74*  CALCIUM 8.0*   GFR: Estimated Creatinine Clearance: 31.5 mL/min (A) (by C-G formula based on SCr of 3.74 mg/dL (H)). Liver Function Tests: Recent Labs  Lab 11/30/20 1208  AST 40  ALT 23  ALKPHOS 55  BILITOT 0.8  PROT 6.5  ALBUMIN 2.9*   Recent Labs  Lab 11/30/20 1208  LIPASE 33   No results for input(s): AMMONIA in the last 168 hours. Coagulation Profile: No results for input(s): INR, PROTIME in the last 168 hours. Cardiac Enzymes: No results for input(s): CKTOTAL, CKMB, CKMBINDEX, TROPONINI in the last 168 hours. BNP (last 3 results) No results for input(s): PROBNP in the last 8760 hours. HbA1C: No results for input(s): HGBA1C in the last 72 hours. CBG: Recent Labs  Lab 11/30/20 1436  GLUCAP 361*   Lipid Profile: No results for input(s): CHOL, HDL, LDLCALC, TRIG, CHOLHDL, LDLDIRECT in  the last 72 hours. Thyroid Function Tests: No results for input(s): TSH, T4TOTAL, FREET4, T3FREE, THYROIDAB in the last 72 hours. Anemia Panel: No results for input(s): VITAMINB12, FOLATE, FERRITIN, TIBC, IRON, RETICCTPCT in the last 72 hours. Urine analysis: No results found for: COLORURINE, APPEARANCEUR, LABSPEC, PHURINE, GLUCOSEU, HGBUR, BILIRUBINUR, KETONESUR, PROTEINUR, UROBILINOGEN, NITRITE, LEUKOCYTESUR  Radiological Exams on Admission: No results found.  EKG: Independently reviewed.  Normal sinus rhythm  Assessment/Plan Active Problems:   AKI (acute kidney injury) (HCC)   Hyponatremia   Hyperglycemia   Essential hypertension   Acute gastritis      Acute kidney injury Most likely secondary to GI losses from nausea and vomiting as well as medication induced. Patient has been taking NSAIDs for fever as well as  lisinopril/HCTZ At baseline patient has a serum creatinine of 1.39 today on admission it is 3.74, his BUN is also elevated from 24-79 Aggressive IV fluid resuscitation Hold lisinopril/hydrochlorothiazide Repeat renal parameters in a.m.    Acute gastritis Most likely related to NSAID use Supportive care with antiemetics, IV fluids and IV PPI    Hyponatremia Multifactorial and secondary to hypovolemia from GI fluid losses related to nausea and vomiting as well as secondary to hypoglycemia We will hydrate patient with normal saline Hold hydrochlorothiazide for now    Diabetes mellitus with complications of hyperglycemia Aggressive IV fluid hydration Place patient on a clear liquid diet Sliding scale coverage with Accu-Cheks before meals and at bedtime   Hypertension Patient is normotensive Hold amlodipine for now   DVT prophylaxis: Lovenox Code Status: Full code Family Communication: Greater than 50% of time was spent discussing patient's condition and plan of care with him at the bedside.  All questions and concerns have been addressed.  He  verbalizes understanding and agrees with the plan. Disposition Plan: Back to previous home environment Consults called: None    Prabhav Faulkenberry MD [facility]     11/30/2020, 3:14 PM

## 2020-11-30 NOTE — ED Provider Notes (Signed)
[facility] Emergency Department Provider Note  ____________________________________________   Event Date/Time   First MD Initiated Contact with Patient 11/30/20 1342     (approximate)  I have reviewed the triage vital signs and the nursing notes.   HISTORY  Chief Complaint Weakness and Emesis   HPI Vincent Mcfarland is a 40 y.o. male with a past medical history of HTN and diabetes who presents for assessment approximately 10 days of nonbloody nonbilious vomiting and weakness.  Patient states he thought he was diagnosed with COVID on 1/31 at that time had some fevers and chills as well as some cough and congestion but these have since resolved although he is still has some intermittent nausea and vomiting and very poor appetite.  He denies any chest pain, cough, shortness of breath, Donnell pain, burning with urination, diarrhea, headache, earache, sore throat, rash or extremity pain.  No other acute symptoms at this time.  States he has been taking ibuprofen intermittently.  Denies significant EtOH use or illicit drug use.  He does state he has been taking ibuprofen intermittently over last couple days.          Past Medical History:  Diagnosis Date  . Diabetes mellitus without complication (HCC)   . Hypertension     Patient Active Problem List   Diagnosis Date Noted  . AKI (acute kidney injury) (HCC) 11/30/2020  . Hyponatremia 11/30/2020  . Hyperglycemia 11/30/2020  . Essential hypertension 11/30/2020  . Acute gastritis 11/30/2020    Past Surgical History:  Procedure Laterality Date  . EYE SURGERY      Prior to Admission medications   Medication Sig Start Date End Date Taking? Authorizing Provider  amLODipine (NORVASC) 10 MG tablet Take 1 tablet by mouth daily 01/02/19  Yes [provider]  insulin NPH Human (NOVOLIN N) 100 UNIT/ML injection Inject 25 Units into the skin 2 (two) times daily before a meal.   Yes [provider]   insulin regular (NOVOLIN R) 100 units/mL injection Inject 15 Units into the skin 2 (two) times daily before a meal.   Yes [provider]  lisinopril-hydrochlorothiazide (ZESTORETIC) 20-25 MG tablet Take 1 tablet by mouth daily. 11/01/20  Yes [provider]    Allergies Patient has no known allergies.  Family History  Problem Relation Age of Onset  . Healthy Mother   . Aneurysm Father        brain  . Hypertension Father     Social History Social History   Tobacco Use  . Smoking status: Never Smoker  . Smokeless tobacco: Never Used  Vaping Use  . Vaping Use: Never used  Substance Use Topics  . Alcohol use: No  . Drug use: No    Review of Systems  Review of Systems  Constitutional: Positive for malaise/fatigue. Negative for chills and fever.  HENT: Negative for sore throat.   Eyes: Negative for pain.  Respiratory: Negative for cough and stridor.   Cardiovascular: Negative for chest pain.  Gastrointestinal: Positive for nausea and vomiting.  Genitourinary: Negative for dysuria.  Musculoskeletal: Negative for myalgias.  Skin: Negative for rash.  Neurological: Positive for weakness. Negative for seizures, loss of consciousness and headaches.  Psychiatric/Behavioral: Negative for suicidal ideas.  All other systems reviewed and are negative.     ____________________________________________   PHYSICAL EXAM:  VITAL SIGNS: ED Triage Vitals  Enc Vitals Group     BP 11/30/20 1158 119/90     Pulse Rate 11/30/20 1158 92  Resp 11/30/20 1158 16     Temp 11/30/20 1158 98.5 F (36.9 C)     Temp Source 11/30/20 1158 Oral     SpO2 11/30/20 1158 97 %     Weight 11/30/20 1159 215 lb (97.5 kg)     Height 11/30/20 1159 6' (1.829 m)     Head Circumference --      Peak Flow --      Pain Score 11/30/20 1159 7     Pain Loc --      Pain Edu? --      Excl. in GC? --    Vitals:   11/30/20 1158  BP: 119/90  Pulse: 92  Resp: 16  Temp: 98.5 F (36.9 C)   SpO2: 97%   Physical Exam Vitals and nursing note reviewed.  Constitutional:      Appearance: He is well-developed and well-nourished.  HENT:     Head: Normocephalic and atraumatic.     Right Ear: External ear normal.     Left Ear: External ear normal.     Nose: Nose normal.     Mouth/Throat:     Mouth: Mucous membranes are dry.  Eyes:     Conjunctiva/sclera: Conjunctivae normal.  Cardiovascular:     Rate and Rhythm: Normal rate and regular rhythm.     Heart sounds: No murmur heard.   Pulmonary:     Effort: Pulmonary effort is normal. No respiratory distress.     Breath sounds: Normal breath sounds.  Abdominal:     Palpations: Abdomen is soft.     Tenderness: There is no abdominal tenderness.  Musculoskeletal:        General: No edema.     Cervical back: Neck supple.  Skin:    General: Skin is warm and dry.     Capillary Refill: Capillary refill takes 2 to 3 seconds.  Neurological:     Mental Status: He is alert and oriented to person, place, and time.  Psychiatric:        Mood and Affect: Mood and affect and mood normal.      ____________________________________________   LABS (all labs ordered are listed, but only abnormal results are displayed)  Labs Reviewed  BASIC METABOLIC PANEL - Abnormal; Notable for the following components:      Result Value   Sodium 129 (*)    Chloride 93 (*)    Glucose, Bld 433 (*)    BUN 79 (*)    Creatinine, Ser 3.74 (*)    Calcium 8.0 (*)    GFR, Estimated 20 (*)    All other components within normal limits  HEPATIC FUNCTION PANEL - Abnormal; Notable for the following components:   Albumin 2.9 (*)    All other components within normal limits  CBG MONITORING, ED - Abnormal; Notable for the following components:   Glucose-Capillary 361 (*)    All other components within normal limits  SARS CORONAVIRUS 2 BY RT PCR (HOSPITAL ORDER, PERFORMED IN [facility])  CBC  LIPASE, BLOOD  URINALYSIS, COMPLETE (UACMP) WITH  MICROSCOPIC  HEMOGLOBIN A1C  HIV ANTIBODY (ROUTINE TESTING W REFLEX)   ____________________________________________  EKG  Sinus rhythm with a ventricular rate of 95, normal axis, unremarkable intervals, no evidence of acute ischemia or other significant underlying arrhythmia. ____________________________________________  RADIOLOGY  ED MD interpretation:  Official radiology report(s): No results found.  ____________________________________________   PROCEDURES  Procedure(s) performed (including Critical Care):  .1-3 Lead EKG Interpretation Performed by: Antoine Primas  P, MD Authorized by: Gilles Chiquito, MD     Interpretation: normal     ECG rate assessment: normal     Rhythm: sinus rhythm     Ectopy: none     Conduction: normal       ____________________________________________   INITIAL IMPRESSION / ASSESSMENT AND PLAN / ED COURSE        Patient presents with [place] to history exam for assessment of persistent nausea vomiting and weakness after what he thought was a positive Covid test.  He is afebrile hemodynamically stable arrival.  When I did review his Covid testing done on 1/31 it appears this is actually a negative result.  However given patient states multiple family numbers tested positive and he had cough congestion fevers and chills initially is certainly possible he had Covid although we will plan to reswab today.  There is no history of recent trauma or injury or falls and have low suspicion for toxic ingestion.  Differential for his nausea vomiting weakness includes DKA, metabolic derangements, gastritis, atypical present of ACS, and others.  Very low suspicion for SBO as patient states he is having regular bowel those.  He denies any abdominal pain and has no tenderness to suggest cholecystitis or pancreatitis.  Suspicion for atypical presentation of ACS given reassuring EKG.  BMP shows pseudohyponatremia with NA 129 the setting of hyperglycemia with a  glucose of 433.  Creatinine is elevated at 3.74 compared to 1.392 weeks ago.  No other significant derangements.  No evidence of DKA.  CBC is unremarkable.  No evidence of leukocytosis or anemia.  Lipase not consistent with acute pancreatitis.  Hepatic function panel unremarkable.  Suspect patient's AKI is multifactorial likely related to volume loss from vomiting as well as possible injury from NSAIDs.  I will plan to admit to medicine service for further evaluation and management.    ____________________________________________   FINAL CLINICAL IMPRESSION(S) / ED DIAGNOSES  Final diagnoses:  AKI (acute kidney injury) (HCC)  Nausea and vomiting, intractability of vomiting not specified, unspecified vomiting type    Medications  ondansetron (ZOFRAN-ODT) disintegrating tablet 4 mg (has no administration in time range)  enoxaparin (LOVENOX) injection 30 mg (has no administration in time range)  0.9 %  sodium chloride infusion (has no administration in time range)  metoCLOPramide (REGLAN) injection 10 mg (10 mg Intravenous Given 11/30/20 1447)  insulin aspart (novoLOG) injection 0-15 Units (has no administration in time range)  pantoprazole (PROTONIX) injection 40 mg (has no administration in time range)  ondansetron (ZOFRAN-ODT) disintegrating tablet 4 mg (4 mg Oral Given 11/30/20 1210)  lactated ringers bolus 1,000 mL (1,000 mLs Intravenous New Bag/Given 11/30/20 1438)  0.9 %  sodium chloride infusion ( Intravenous New Bag/Given 11/30/20 1447)     ED Discharge Orders    None       Note:  This document was prepared using Dragon voice recognition software and may include unintentional dictation errors.   Gilles Chiquito, MD 11/30/20 [phone]

## 2020-11-30 NOTE — ED Triage Notes (Signed)
Patient c/o weakness with N/V since 11/20/20 when he was diagnosed with covid. Reports he cannot hold anything down.

## 2020-12-01 DIAGNOSIS — U071 COVID-19: Principal | ICD-10-CM

## 2020-12-01 DIAGNOSIS — I10 Essential (primary) hypertension: Secondary | ICD-10-CM

## 2020-12-01 LAB — BASIC METABOLIC PANEL
Anion gap: 8 (ref 5–15)
BUN: 59 mg/dL — ABNORMAL HIGH (ref 6–20)
CO2: 25 mmol/L (ref 22–32)
Calcium: 7.5 mg/dL — ABNORMAL LOW (ref 8.9–10.3)
Chloride: 102 mmol/L (ref 98–111)
Creatinine, Ser: 2.87 mg/dL — ABNORMAL HIGH (ref 0.61–1.24)
GFR, Estimated: 27 mL/min — ABNORMAL LOW (ref >60)
Glucose, Bld: 165 mg/dL — ABNORMAL HIGH (ref 70–99)
Potassium: 4 mmol/L (ref 3.5–5.1)
Sodium: 135 mmol/L (ref 135–145)

## 2020-12-01 LAB — CBC
HCT: 34.8 % — ABNORMAL LOW (ref 39.0–52.0)
Hemoglobin: 12.1 g/dL — ABNORMAL LOW (ref 13.0–17.0)
MCH: 30.2 pg (ref 26.0–34.0)
MCHC: 34.8 g/dL (ref 30.0–36.0)
MCV: 86.8 fL (ref 80.0–100.0)
Platelets: 226 10*3/uL (ref 150–400)
RBC: 4.01 MIL/uL — ABNORMAL LOW (ref 4.22–5.81)
RDW: 11.9 % (ref 11.5–15.5)
WBC: 7.3 10*3/uL (ref 4.0–10.5)
nRBC: 0 % (ref 0.0–0.2)

## 2020-12-01 LAB — GLUCOSE, CAPILLARY
Glucose-Capillary: 171 mg/dL — ABNORMAL HIGH (ref 70–99)
Glucose-Capillary: 278 mg/dL — ABNORMAL HIGH (ref 70–99)
Glucose-Capillary: 185 mg/dL — ABNORMAL HIGH (ref 70–99)
Glucose-Capillary: 343 mg/dL — ABNORMAL HIGH (ref 70–99)

## 2020-12-01 MED ORDER — SODIUM CHLORIDE 0.9 % IV SOLN
100.0000 mg | Freq: Every day | INTRAVENOUS | Status: DC
  Administered 2020-12-02: 100 mg via INTRAVENOUS
  Filled 2020-12-01: qty 20

## 2020-12-01 MED ORDER — SODIUM CHLORIDE 0.9 % IV SOLN
200.0000 mg | Freq: Once | INTRAVENOUS | Status: AC
  Administered 2020-12-01: 200 mg via INTRAVENOUS
  Filled 2020-12-01: qty 200

## 2020-12-01 MED ORDER — INSULIN GLARGINE 100 UNIT/ML ~~LOC~~ SOLN
10.0000 [IU] | Freq: Every day | SUBCUTANEOUS | Status: DC
  Administered 2020-12-01: 22:00:00 10 [IU] via SUBCUTANEOUS
  Filled 2020-12-01 (×2): qty 0.1

## 2020-12-01 MED ORDER — ENOXAPARIN SODIUM 40 MG/0.4ML ~~LOC~~ SOLN
40.0000 mg | SUBCUTANEOUS | Status: DC
  Filled 2020-12-01: qty 0.4

## 2020-12-01 MED ORDER — AMLODIPINE BESYLATE 10 MG PO TABS
10.0000 mg | ORAL_TABLET | Freq: Every day | ORAL | Status: DC
  Administered 2020-12-01 – 2020-12-02 (×2): 10 mg via ORAL
  Filled 2020-12-01 (×2): qty 1

## 2020-12-01 NOTE — Consult Note (Signed)
Remdesivir - Pharmacy Brief Note   O:  ALT: 23 CXR: N/A SpO2: On room air   A/P:  11/30/20 SARS-CoV-2 PCR (+)  Remdesivir 200 mg IVPB once followed by 100 mg IVPB daily x 4 days.   Vincent Mcfarland 12/01/2020 7:42 AM

## 2020-12-01 NOTE — Progress Notes (Signed)
[facility]  PROGRESS NOTE  Vincent Mcfarland EXH:[id number] DOB: 1981/05/23 DOA: 11/30/2020 PCP: Grayce Sessions, NP   Brief HPI:   40 year old male with history of insulin-dependent diabetes mellitus hypertension came to ED with complaints of nausea, vomiting, inability to keep any food down.  Patient stated that he had fever, cough and congestion.  Developed nausea and vomiting without diarrhea.  Patient son had recently tested positive for COVID-19 virus on 11/26/2020.  At that time patient had negative PCR test. In the ED SARS-CoV-2 test came back positive. Patient also found to have acute kidney injury on CKD stage II  with creatinine 3.74   Subjective   Patient seen and examined, denies any complaints.  No chest pain or shortness of breath.   Assessment/Plan:     1. Acute kidney injury-likely from GI losses from nausea vomiting from COVID-19 infection.  Patient has been taking NSAIDs for fever as well as HCTZ with lisinopril.  Patient baseline creatinine 1.39.  Today creatinine has improved to 2.87 from IV fluids.  Follow BMP in am. 2. COVID-19 infection-SARS-CoV-2 RT-PCR test was positive.  Patient not requiring oxygen.  O2 sats 98% on room air.  Will start patient on remdesivir per pharmacy consultation.  Monitor off steroids.  As patient is not hypoxic.  Will follow CRP in AM. 3. Acute gastritis-likely from NSAID use, started on antibiotics, PPI 4. Hyponatremia-likely from dehydration from GI losses.  Started on IV fluids.  Today sodium is improved to 135. 5. Diabetes mellitus type 2-patient started on sliding scale insulin with NovoLog.  CBGs elevated.  Will start Lantus 10 units subcu nightly. 6. Hypertension-blood pressure is elevated, will restart amlodipine.    COVID-19 Labs  No results for input(s): DDIMER, FERRITIN, LDH, CRP in the last 72 hours.  Lab Results  Component Value Date   SARSCOV2NAA POSITIVE (A) 11/30/2020   SARSCOV2NAA Not Detected 11/26/2020      Scheduled medications:   . enoxaparin (LOVENOX) injection  40 mg Subcutaneous Q24H  . insulin aspart  0-15 Units Subcutaneous TID WC  . pantoprazole (PROTONIX) IV  40 mg Intravenous Q24H         CBG: Recent Labs  Lab 11/30/20 1436 11/30/20 1608 11/30/20 2017 12/01/20 0735 12/01/20 1147  GLUCAP 361* 333* 147* 171* 278*    SpO2: 98 %    CBC: Recent Labs  Lab 11/30/20 1208 12/01/20 0704  WBC 7.7 7.3  HGB 14.4 12.1*  HCT 41.6 34.8*  MCV 86.8 86.8  PLT 213 226    Basic Metabolic Panel: Recent Labs  Lab 11/30/20 1208 12/01/20 0704  NA 129* 135  K 4.6 4.0  CL 93* 102  CO2 22 25  GLUCOSE 433* 165*  BUN 79* 59*  CREATININE 3.74* 2.87*  CALCIUM 8.0* 7.5*     Liver Function Tests: Recent Labs  Lab 11/30/20 1208  AST 40  ALT 23  ALKPHOS 55  BILITOT 0.8  PROT 6.5  ALBUMIN 2.9*     Antibiotics: Anti-infectives (From admission, onward)   Start     Dose/Rate Route Frequency Ordered Stop   12/02/20 1000  remdesivir 100 mg in sodium chloride 0.9 % 100 mL IVPB       "Followed by" Linked Group Details   100 mg 200 mL/hr over 30 Minutes Intravenous Daily 12/01/20 0741 12/06/20 0959   12/01/20 1000  remdesivir 200 mg in sodium chloride 0.9% 250 mL IVPB       "Followed by" Linked Group Details   200  mg 580 mL/hr over 30 Minutes Intravenous Once 12/01/20 0741 12/01/20 1100       DVT prophylaxis: Lovenox  Code Status: Full code  Family Communication: No family at bedside   Consultants:    Procedures:      Objective   Vitals:   11/30/20 2100 12/01/20 0357 12/01/20 0910 12/01/20 1149  BP:  132/82 (!) 146/89 (!) 166/92  Pulse:  89 89 87  Resp:  16 18 18   Temp: 98 F (36.7 C) 98.4 F (36.9 C) 98.4 F (36.9 C) 98.2 F (36.8 C)  TempSrc: Oral Oral  Oral  SpO2:  97% 98% 98%  Weight:      Height:        Intake/Output Summary (Last 24 hours) at 12/01/2020 1619 Last data filed at 12/01/2020 1425 Gross per 24 hour  Intake 633.58 ml   Output 2550 ml  Net -1916.42 ml    02/03 1901 - 02/05 0700 In: 633.6 [P.O.:370; I.V.:263.6] Out: 700 [Urine:700]  Filed Weights   11/30/20 1159  Weight: 97.5 kg    Physical Examination:    General-appears in no acute distress  Heart-S1-S2, regular, no murmur auscultated  Lungs-clear to auscultation bilaterally, no wheezing or crackles auscultated  Abdomen-soft, nontender, no organomegaly  Extremities-no edema in the lower extremities  Neuro-alert, oriented x3, no focal deficit noted   Status is: Inpatient  Dispo: The patient is from: Home              Anticipated d/c is to: Home              Anticipated d/c date is: 12/03/2020              Patient currently not stable for discharge  Barrier to discharge-acute kidney injury on CKD stage II        Data Reviewed:   Recent Results (from the past 240 hour(s))  Novel Coronavirus, NAA ([facility])     Status: None   Collection Time: 11/26/20  3:26 PM   Specimen: Nasopharyngeal(NP) swabs in vial transport medium   Nasopharynge  Screenin  Result Value Ref Range Status   SARS-CoV-2, NAA Not Detected Not Detected Final    Comment: This nucleic acid amplification test was developed and its performance characteristics determined by 11/28/20. Nucleic acid amplification tests include RT-PCR and TMA. This test has not been FDA cleared or approved. This test has been authorized by FDA under an Emergency Use Authorization (EUA). This test is only authorized for the duration of time the declaration that circumstances exist justifying the authorization of the emergency use of in vitro diagnostic tests for detection of SARS-CoV-2 virus and/or diagnosis of COVID-19 infection under section 564(b)(1) of the Act, 21 U.S.C. [employer]) (1), unless the authorization is terminated or revoked sooner. When diagnostic testing is negative, the possibility of a false negative result should be considered in the context of a  patient's recent exposures and the presence of clinical signs and symptoms consistent with COVID-19. An individual without symptoms of COVID-19 and who is not shedding SARS-CoV-2 virus wo uld expect to have a negative (not detected) result in this assay.   SARS-COV-2, NAA 2 DAY TAT     Status: None   Collection Time: 11/26/20  3:26 PM   Nasopharynge  Screenin  Result Value Ref Range Status   SARS-CoV-2, NAA 2 DAY TAT Performed  Final  SARS Coronavirus 2 by RT PCR (hospital order, performed in [facility] hospital lab) Nasopharyngeal Nasopharyngeal Swab  Status: Abnormal   Collection Time: Dec 21, 2020  4:22 PM   Specimen: Nasopharyngeal Swab  Result Value Ref Range Status   SARS Coronavirus 2 POSITIVE (A) NEGATIVE Final    Comment: RESULT CALLED TO, READ BACK BY AND VERIFIED WITH: KENDALL FLANNAGAN AT 1827 ON 2020/12/21 BY SS (NOTE) SARS-CoV-2 target nucleic acids are DETECTED  SARS-CoV-2 RNA is generally detectable in upper respiratory specimens  during the acute phase of infection.  Positive results are indicative  of the presence of the identified virus, but do not rule out bacterial infection or co-infection with other pathogens not detected by the test.  Clinical correlation with patient history and  other diagnostic information is necessary to determine patient infection status.  The expected result is negative.  Fact Sheet for Patients:   BoilerBrush.com.cy   Fact Sheet for Healthcare Providers:   https://pope.com/    This test is not yet approved or cleared by the [place] FDA and  has been authorized for detection and/or diagnosis of SARS-CoV-2 by FDA under an Emergency Use Authorization (EUA).  This EUA will remain in effect (meaning  this test can be used) for the duration of  the COVID-19 declaration under Section 564(b)(1) of the Act, 21 U.S.C. section 360-bbb-3(b)(1), unless the authorization is terminated or  revoked sooner.  Performed at [facility], [address] [place], [place] [address]     Recent Labs  Lab 2020/12/21 1208  LIPASE 33    Studies:  US Renal  Result Date: 2020-12-21 CLINICAL DATA:  Acute kidney injury in this 40 year old male. EXAM: RENAL / URINARY TRACT ULTRASOUND COMPLETE COMPARISON:  None FINDINGS: Right Kidney: Renal measurements: 11.5 x 4.9 x 5.9 cm = volume: 173 mL. Increased cortical echogenicity without signs of lesion, hydronephrosis or visible calculus. No perinephric fluid. Left Kidney: Renal measurements: 12.3 x 7.3 x 6.7 cm = volume: 316 mL. Increased cortical echogenicity without signs of lesion, hydronephrosis or visible calculus. No perinephric fluid. Bladder: Appears normal for degree of bladder distention. Other: None. IMPRESSION: Echogenic bilateral kidneys, as can be seen in the setting of medical renal disease. No hydronephrosis. Electronically Signed   By: Donzetta Kohut M.D.   On: 21-Dec-2020 16:22       Lyndal Reggio S Lakeysha Slutsky   [facility] If 7PM-7AM, please contact night-coverage at [email or web address], Office  [phone]   12/01/2020, 4:19 PM  LOS: 1 day

## 2020-12-01 NOTE — Progress Notes (Signed)
PHARMACIST - PHYSICIAN COMMUNICATION  CONCERNING:  Enoxaparin (Lovenox) for DVT Prophylaxis    RECOMMENDATION: Patient was prescribed enoxaparin 30 mg q24 hours for VTE prophylaxis.   Filed Weights   11/30/20 1159  Weight: 97.5 kg (215 lb)    Body mass index is 29.16 kg/m.  Estimated Creatinine Clearance: 41 mL/min (A) (by C-G formula based on SCr of 2.87 mg/dL (H)).  Patient is candidate for enoxaparin 40 mg every 24 hours based on CrCl > 60ml/min and Weight > 45kg  DESCRIPTION: Pharmacy has adjusted enoxaparin dose per [facility] policy.  Patient is now receiving enoxaparin 40 mg every 24 hours    Tressie Ellis 12/01/2020 10:42 AM

## 2020-12-01 NOTE — Progress Notes (Signed)
Initial Nutrition Assessment  RD working remotely.  DOCUMENTATION CODES:   Not applicable  INTERVENTION:  Patient declined nutrition interventions recommended by RD. Encouraged adequate intake of calories and protein at meals.  NUTRITION DIAGNOSIS:   Increased nutrient needs related to catabolic illness (COVID-19) as evidenced by estimated needs.  GOAL:   Patient will meet greater than or equal to 90% of their needs  MONITOR:   PO intake,Supplement acceptance,Labs,Weight trends,I & O's  REASON FOR ASSESSMENT:   Malnutrition Screening Tool    ASSESSMENT:   40 year old male with PMHx of DM, HTN admitted with COVID-19, N/V, acute gastritis, AKI likely from GI losses.   Spoke with patient over the phone. He reports he had decreased appetite and intake for the past 3-4 days related to N/V. He was only able to tolerate liquids. His diet was advanced to soft today. He reports he is tolerating well and his nausea has improved. He reports he ate most of his breakfast and lunch today but unable to provide specifics. No meal completion documentation available. Patient reports at baseline he has a good appetite and intake. Discussed increased nutrient requirements related to catabolic nature of COVID-19. Patient declined nutrition interventions recommended by RD.   Patient reports his UBW is 215 lbs and that he believes he has lost 10 lbs over the last several days. That would be significant weight loss for <1 week, but current weight documented in chart is 97.5 kg (215 lbs) and suspect this was not a true measured weight. Will continue to monitor.  Medications reviewed and include: Novolog 0-15 units Tid, Lantus 10 units QHS, Protonix, NS at 100 mL/hr, remdesivir.  Labs reviewed: CBG 147-278, BUN 59, Creatinine 2.87.  Unable to determine if patient meets criteria for malnutrition at this time. Patient is at risk for development of acute malnutrition.  NUTRITION - FOCUSED PHYSICAL  EXAM:  Unable to complete at this time as RD is working remotely.  Diet Order:   Diet Order            DIET SOFT Room service appropriate? Yes; Fluid consistency: Thin  Diet effective now                EDUCATION NEEDS:   No education needs have been identified at this time  Skin:  Skin Assessment: Reviewed RN Assessment  Last BM:  Unknown  Height:   Ht Readings from Last 1 Encounters:  11/30/20 6' (1.829 m)   Weight:   Wt Readings from Last 1 Encounters:  11/30/20 97.5 kg   BMI:  Body mass index is 29.16 kg/m.  Estimated Nutritional Needs:   Kcal:  2300-2500  Protein:  115-125 grams  Fluid:  >/= 2.3 L/day  Felix Pacini, MS, RD, LDN Pager number available on Amion

## 2020-12-02 LAB — BASIC METABOLIC PANEL
Anion gap: 9 (ref 5–15)
BUN: 46 mg/dL — ABNORMAL HIGH (ref 6–20)
CO2: 19 mmol/L — ABNORMAL LOW (ref 22–32)
Calcium: 7.3 mg/dL — ABNORMAL LOW (ref 8.9–10.3)
Chloride: 101 mmol/L (ref 98–111)
Creatinine, Ser: 2.55 mg/dL — ABNORMAL HIGH (ref 0.61–1.24)
GFR, Estimated: 32 mL/min — ABNORMAL LOW (ref >60)
Glucose, Bld: 419 mg/dL — ABNORMAL HIGH (ref 70–99)
Potassium: 4.4 mmol/L (ref 3.5–5.1)
Sodium: 129 mmol/L — ABNORMAL LOW (ref 135–145)

## 2020-12-02 LAB — CBC
HCT: 35.4 % — ABNORMAL LOW (ref 39.0–52.0)
Hemoglobin: 12.2 g/dL — ABNORMAL LOW (ref 13.0–17.0)
MCH: 30 pg (ref 26.0–34.0)
MCHC: 34.5 g/dL (ref 30.0–36.0)
MCV: 87.2 fL (ref 80.0–100.0)
Platelets: 255 10*3/uL (ref 150–400)
RBC: 4.06 MIL/uL — ABNORMAL LOW (ref 4.22–5.81)
RDW: 11.8 % (ref 11.5–15.5)
WBC: 8.7 10*3/uL (ref 4.0–10.5)
nRBC: 0 % (ref 0.0–0.2)

## 2020-12-02 LAB — FERRITIN: Ferritin: 472 ng/mL — ABNORMAL HIGH (ref 24–336)

## 2020-12-02 LAB — GLUCOSE, CAPILLARY
Glucose-Capillary: 386 mg/dL — ABNORMAL HIGH (ref 70–99)
Glucose-Capillary: 247 mg/dL — ABNORMAL HIGH (ref 70–99)

## 2020-12-02 LAB — C-REACTIVE PROTEIN: CRP: 0.7 mg/dL (ref <1.0)

## 2020-12-02 MED ORDER — METOPROLOL TARTRATE 25 MG PO TABS: 25.0000 mg | ORAL_TABLET | Freq: Two times a day (BID) | ORAL | 11 refills | Status: AC

## 2020-12-02 MED ORDER — METOPROLOL TARTRATE 25 MG PO TABS: 25.0000 mg | ORAL_TABLET | Freq: Two times a day (BID) | ORAL | 11 refills | Status: DC

## 2020-12-02 MED ORDER — INSULIN ASPART 100 UNIT/ML ~~LOC~~ SOLN
5.0000 [IU] | Freq: Once | SUBCUTANEOUS | Status: AC
  Administered 2020-12-02: 06:00:00 5 [IU] via INTRAVENOUS
  Filled 2020-12-02: qty 0.05

## 2020-12-02 MED ORDER — PROMETHAZINE HCL 25 MG/ML IJ SOLN: 12.5000 mg | Freq: Four times a day (QID) | INTRAMUSCULAR | Status: DC | PRN

## 2020-12-02 NOTE — Progress Notes (Addendum)
Blood sugar 386 before breakfast. Upon administration of 15 units novolog, patient verbalized that he checked his BS with his glucometer (BS 376) and used his home insulin 12units of Novolin R.  Therefore 15units novolog were not given.  Educated the patient about the importance of not using home meds while in the hospital.  Home meds will be sent to pharmacy for storage. Pt is in agreement.   Patient refuses this Clinical research associate to store meds in the pharmacy.  Patient verbalized that he will not use his home meds during his stay.

## 2020-12-02 NOTE — Progress Notes (Signed)
Patient is being discharged home.  Discharge papers given and explained to pt.  Pt verbalized understanding.  Meds and f/u appointments reviewed. Rx sent electronically to pharmacy.  Pt made aware.  Awaiting transportation.

## 2020-12-02 NOTE — Discharge Planning (Signed)
Physician Discharge Summary  Vincent Mcfarland ZOX:[id number] DOB: 1981-05-13 DOA: 11/30/2020  PCP: Grayce Sessions, NP  Admit date: 11/30/2020 Discharge date: 12/02/2020  Time spent: 50 minutes  Recommendations for Outpatient Follow-up:  1. Follow-up nephrology in 3 weeks 2. Follow-up PCP in 2 weeks Discharge Diagnoses:  Active Problems:   AKI (acute kidney injury) (HCC)   Hyponatremia   Hyperglycemia   Essential hypertension   Acute gastritis   Discharge Condition: Stable  Diet recommendation: Heart healthy diet  Filed Weights   11/30/20 1159  Weight: 97.5 kg    History of present illness:   40 year old male with history of insulin-dependent diabetes mellitus hypertension came to ED with complaints of nausea, vomiting, inability to keep any food down.  Patient stated that he had fever, cough and congestion.  Developed nausea and vomiting without diarrhea.  Patient son had recently tested positive for COVID-19 virus on 11/26/2020.  At that time patient had negative PCR test. In the ED SARS-CoV-2 test came back positive. Patient also found to have acute kidney injury on CKD stage II  with creatinine 3.74  Hospital Course:   1. Acute kidney injury-likely from GI losses from nausea vomiting from COVID-19 infection.  Patient has been taking NSAIDs for fever as well as HCTZ with lisinopril.  Patient baseline creatinine 1.39.  Today creatinine has improved to 2.  2.55 after patient was started on IV normal saline.  I called and discussed with nephrologist on-call Dr. Lourdes Sledge, he recommends that patient can be discharged home and follow-up with nephrology in 3 weeks.  He has been advised to avoid NSAIDs for pain.  Drink at least 40 ounces of fluid every day.  2. COVID-19 infection-SARS-CoV-2 RT-PCR test was positive.  Patient not requiring oxygen.  O2 sats 98% on room air.  Patient was started on remdesivir.  He was not started on steroids.  Currently not requiring oxygen.  CRP only 0.7.  He  will be discharged home, no further treatment needed at this time.  3. Acute gastritis-likely from NSAID use, resolved.  4. Hyponatremia-likely from dehydration from GI losses.  Today sodium is 129.   He should follow-up with nephrology in 3 weeks.  5. Hypertension-blood pressure is elevated, will restart amlodipine.    Lisinopril/HCTZ will be discontinued due to renal insufficiency.  Will start metoprolol 25 mg p.o. twice daily.  6. Diabetes mellitus type 2-CBGs elevated, continue home regimen including NPH insulin, regular insulin.   Procedures:    Consultations:    Discharge Exam: Vitals:   12/02/20 0548 12/02/20 0755  BP: (!) 146/78 (!) 154/94  Pulse: (!) 107 (!) 102  Resp: 16   Temp: 98.7 F (37.1 C) 98.3 F (36.8 C)  SpO2: 99% 100%    General: Appears in no acute distress Cardiovascular: S1-S2, regular Respiratory: Clear to auscultation bilaterally  Discharge Instructions   Discharge Instructions    Diet - low sodium heart healthy   Complete by: As directed    Discharge instructions   Complete by: As directed    Do not take ibuprofen, Aleve, Motrin, Advil for pain.  Take only Tylenol for pain as needed.  Drink at least 40 ounces of fluid every day.  Follow-up kidney specialist in 3 weeks.   Increase activity slowly   Complete by: As directed      Allergies as of 12/02/2020   No Known Allergies     Medication List    STOP taking these medications   lisinopril-hydrochlorothiazide 20-25 MG tablet Commonly  known as: ZESTORETIC     TAKE these medications   amLODipine 10 MG tablet Commonly known as: NORVASC Take 1 tablet by mouth daily   insulin NPH Human 100 UNIT/ML injection Commonly known as: NOVOLIN N Inject 25 Units into the skin 2 (two) times daily before a meal.   insulin regular 100 units/mL injection Commonly known as: NOVOLIN R Inject 15 Units into the skin 2 (two) times daily before a meal.   metoprolol tartrate 25 MG tablet Commonly  known as: LOPRESSOR Take 1 tablet (25 mg total) by mouth 2 (two) times daily.      No Known Allergies  Follow-up Information    Grayce Sessions, NP In 2 weeks.   Specialty: Internal Medicine Why: Patient to make own follow up appt Contact information: [address] [place] [address] [phone]        Mady Haagensen, MD In 3 weeks.   Specialty: Nephrology Why: Patient to make own follow up appt Contact information: [address] [place] [address] [phone]                The results of significant diagnostics from this hospitalization (including imaging, microbiology, ancillary and laboratory) are listed below for reference.    Significant Diagnostic Studies: US Renal  Result Date: 11/30/2020 CLINICAL DATA:  Acute kidney injury in this 40 year old male. EXAM: RENAL / URINARY TRACT ULTRASOUND COMPLETE COMPARISON:  None FINDINGS: Right Kidney: Renal measurements: 11.5 x 4.9 x 5.9 cm = volume: 173 mL. Increased cortical echogenicity without signs of lesion, hydronephrosis or visible calculus. No perinephric fluid. Left Kidney: Renal measurements: 12.3 x 7.3 x 6.7 cm = volume: 316 mL. Increased cortical echogenicity without signs of lesion, hydronephrosis or visible calculus. No perinephric fluid. Bladder: Appears normal for degree of bladder distention. Other: None. IMPRESSION: Echogenic bilateral kidneys, as can be seen in the setting of medical renal disease. No hydronephrosis. Electronically Signed   By: Donzetta Kohut M.D.   On: 11/30/2020 16:22    Microbiology: Recent Results (from the past 240 hour(s))  Novel Coronavirus, NAA ([facility])     Status: None   Collection Time: 11/26/20  3:26 PM   Specimen: Nasopharyngeal(NP) swabs in vial transport medium   Nasopharynge  Screenin  Result Value Ref Range Status   SARS-CoV-2, NAA Not Detected Not Detected Final    Comment: This nucleic acid amplification test was developed and its  performance characteristics determined by [employer]. Nucleic acid amplification tests include RT-PCR and TMA. This test has not been FDA cleared or approved. This test has been authorized by FDA under an Emergency Use Authorization (EUA). This test is only authorized for the duration of time the declaration that circumstances exist justifying the authorization of the emergency use of in vitro diagnostic tests for detection of SARS-CoV-2 virus and/or diagnosis of COVID-19 infection under section 564(b)(1) of the Act, 21 U.S.C. 295MWU-1(L) (1), unless the authorization is terminated or revoked sooner. When diagnostic testing is negative, the possibility of a false negative result should be considered in the context of a patient's recent exposures and the presence of clinical signs and symptoms consistent with COVID-19. An individual without symptoms of COVID-19 and who is not shedding SARS-CoV-2 virus wo uld expect to have a negative (not detected) result in this assay.   SARS-COV-2, NAA 2 DAY TAT     Status: None   Collection Time: 11/26/20  3:26 PM   Nasopharynge  Screenin  Result Value Ref Range  Status   SARS-CoV-2, NAA 2 DAY TAT Performed  Final  SARS Coronavirus 2 by RT PCR (hospital order, performed in [facility] hospital lab) Nasopharyngeal Nasopharyngeal Swab     Status: Abnormal   Collection Time: 11/30/20  4:22 PM   Specimen: Nasopharyngeal Swab  Result Value Ref Range Status   SARS Coronavirus 2 POSITIVE (A) NEGATIVE Final    Comment: RESULT CALLED TO, READ BACK BY AND VERIFIED WITH: KENDALL FLANNAGAN AT 1827 ON 11/30/20 BY SS (NOTE) SARS-CoV-2 target nucleic acids are DETECTED  SARS-CoV-2 RNA is generally detectable in upper respiratory specimens  during the acute phase of infection.  Positive results are indicative  of the presence of the identified virus, but do not rule out bacterial infection or co-infection with other pathogens not detected by the test.   Clinical correlation with patient history and  other diagnostic information is necessary to determine patient infection status.  The expected result is negative.  Fact Sheet for Patients:   BoilerBrush.com.cy   Fact Sheet for Healthcare Providers:   https://pope.com/    This test is not yet approved or cleared by the [place] FDA and  has been authorized for detection and/or diagnosis of SARS-CoV-2 by FDA under an Emergency Use Authorization (EUA).  This EUA will remain in effect (meaning  this test can be used) for the duration of  the COVID-19 declaration under Section 564(b)(1) of the Act, 21 U.S.C. section 360-bbb-3(b)(1), unless the authorization is terminated or revoked sooner.  Performed at [facility], [address] [place], [place] [address]      Labs: Basic Metabolic Panel: Recent Labs  Lab 11/30/20 1208 12/01/20 0704 12/02/20 0328  NA 129* 135 129*  K 4.6 4.0 4.4  CL 93* 102 101  CO2 22 25 19*  GLUCOSE 433* 165* 419*  BUN 79* 59* 46*  CREATININE 3.74* 2.87* 2.55*  CALCIUM 8.0* 7.5* 7.3*   Liver Function Tests: Recent Labs  Lab 11/30/20 1208  AST 40  ALT 23  ALKPHOS 55  BILITOT 0.8  PROT 6.5  ALBUMIN 2.9*   Recent Labs  Lab 11/30/20 1208  LIPASE 33   No results for input(s): AMMONIA in the last 168 hours. CBC: Recent Labs  Lab 11/30/20 1208 12/01/20 0704 12/02/20 0328  WBC 7.7 7.3 8.7  HGB 14.4 12.1* 12.2*  HCT 41.6 34.8* 35.4*  MCV 86.8 86.8 87.2  PLT 213 226 255    CBG: Recent Labs  Lab 12/01/20 0735 12/01/20 1147 12/01/20 1637 12/01/20 2145 12/02/20 0757  GLUCAP 171* 278* 185* 343* 386*       Signed:  Meredeth Ide MD.  [facility] 12/02/2020, 11:45 AM

## 2020-12-02 NOTE — TOC Transition Note (Signed)
Transition of Care [facility]) - CM/SW Discharge Note   Patient Details  Name: Vincent Mcfarland MRN: [id number] Date of Birth: 1981/10/17  [facility] CM/SW Contact:  Bing Quarry, RN Phone Number: 12/02/2020, 12:14 PM   Clinical Narrative:   Pt discharging home/self care today. Awaiting transportation. Will follow up with nephrology on own per provider notes. No TOC needs at this time. Will sign off. Gabriel Cirri RN CM    Final next level of care: Home/Self Care Barriers to Discharge: Barriers Resolved   Patient Goals and CMS Choice        Discharge Placement                       Discharge Plan and Services                DME Arranged: N/A DME Agency: NA       HH Arranged: NA HH Agency: NA        Social Determinants of Health (SDOH) Interventions     Readmission Risk Interventions No flowsheet data found.

## 2020-12-03 ENCOUNTER — Telehealth: Payer: Self-pay

## 2020-12-03 NOTE — Telephone Encounter (Signed)
Transition Care Management Unsuccessful Follow-up Telephone Call  Date of discharge and from where:  12/02/2020, [facility]  Attempts:  1st Attempt  Reason for unsuccessful TCM follow-up call:  Left voice message - call placed to # [phone].  Need to inquire if patient is planning to follow up with Gwinda Passe, NP or if has established care with new PCP

## 2020-12-04 ENCOUNTER — Telehealth: Payer: Self-pay

## 2020-12-04 ENCOUNTER — Other Ambulatory Visit (INDEPENDENT_AMBULATORY_CARE_PROVIDER_SITE_OTHER): Payer: Self-pay | Admitting: Primary Care

## 2020-12-04 DIAGNOSIS — R739 Hyperglycemia, unspecified: Secondary | ICD-10-CM

## 2020-12-04 MED ORDER — INSULIN NPH (HUMAN) (ISOPHANE) 100 UNIT/ML ~~LOC~~ SUSP: 25.0000 [IU] | Freq: Two times a day (BID) | SUBCUTANEOUS | 3 refills | Status: DC

## 2020-12-04 MED ORDER — INSULIN REGULAR HUMAN 100 UNIT/ML IJ SOLN: 15.0000 [IU] | Freq: Two times a day (BID) | INTRAMUSCULAR | 3 refills | Status: DC

## 2020-12-04 NOTE — Telephone Encounter (Signed)
Transition Care Management Follow-up Telephone Call  Date of discharge and from where: 12/02/2020, [facility]  How have you been since you were released from the hospital? He said he is "getting better."  Any questions or concerns? Yes - he explained that he has a bad toothache and his mouth is swollen. He has Dispensing optician.  Instructed him to contact his insurer and inquire about dentists in his network.  He was concerned about how soon he could be seen since he tested COVID +. Explained to him that he needs to check with the individual dental providers.   Items Reviewed:  Did the pt receive and understand the discharge instructions provided? Yes   Medications obtained and verified? he said he does not have any Novolin N and would like a refill sent to [facility] in [place]. he has some Novolin R but needs a refill.  he said that he has the amlodipine and metroprol tartrae. He didnt have any questions about his med regime.   Other? No   Any new allergies since your discharge? No   Do you have support at home? Yes , his wife   Home Care and Equipment/Supplies: Were home health services ordered? no If so, what is the name of the agency? N/a  Has the agency set up a time to come to the patient's home? not applicable Were any new equipment or medical supplies ordered?  No What is the name of the medical supply agency? n/a Were you able to get the supplies/equipment? not applicable Do you have any questions related to the use of the equipment or supplies? No   He has a glucometer.   Functional Questionnaire: (I = Independent and D = Dependent) ADLs: independent Follow up appointments reviewed:   PCP Hospital f/u appt confirmed? Yes  Scheduled to see - Gwinda Passe, NP 12/13/2020.   Specialist Hospital f/u appt confirmed? No - he needs to call nephrology to schedule an appointment. Informed him that this CM is not able to schedule that for him.  The phone number for the  provider is on his AVS  Are transportation arrangements needed? No   If their condition worsens, is the pt aware to call PCP or go to the Emergency Dept.? Yes  Was the patient provided with contact information for the PCP's office or ED? Yes  Was to pt encouraged to call back with questions or concerns? Yes

## 2020-12-04 NOTE — Discharge Summary (Signed)
Physician Discharge Summary  Vincent Mcfarland GEX:[id number] DOB: 09/29/1981 DOA: 11/30/2020  PCP: Grayce Sessions, NP  Admit date: 11/30/2020 Discharge date: 12/04/2020  Time spent: 50 minutes  Recommendations for Outpatient Follow-up:  1. Follow-up nephrology in 3 weeks 2. Follow-up PCP in 2 weeks Discharge Diagnoses:  Active Problems:   AKI (acute kidney injury) (HCC)   Hyponatremia   Hyperglycemia   Essential hypertension   Acute gastritis   Discharge Condition: Stable  Diet recommendation: Heart healthy diet  Filed Weights   11/30/20 1159  Weight: 97.5 kg    History of present illness:   40 year old male with history of insulin-dependent diabetes mellitus hypertension came to ED with complaints of nausea, vomiting, inability to keep any food down.  Patient stated that he had fever, cough and congestion.  Developed nausea and vomiting without diarrhea.  Patient son had recently tested positive for COVID-19 virus on 11/26/2020.  At that time patient had negative PCR test. In the ED SARS-CoV-2 test came back positive. Patient also found to have acute kidney injury on CKD stage II  with creatinine 3.74  Hospital Course:   1. Acute kidney injury-likely from GI losses from nausea vomiting from COVID-19 infection.  Patient has been taking NSAIDs for fever as well as HCTZ with lisinopril.  Patient baseline creatinine 1.39.  Today creatinine has improved to 2.  2.55 after patient was started on IV normal saline.  I called and discussed with nephrologist on-call Dr. Lourdes Sledge, he recommends that patient can be discharged home and follow-up with nephrology in 3 weeks.  He has been advised to avoid NSAIDs for pain.  Drink at least 40 ounces of fluid every day.  2. COVID-19 infection-SARS-CoV-2 RT-PCR test was positive.  Patient not requiring oxygen.  O2 sats 98% on room air.  Patient was started on remdesivir.  He was not started on steroids.  Currently not requiring oxygen.  CRP only 0.7.  He  will be discharged home, no further treatment needed at this time.  3. Acute gastritis-likely from NSAID use, resolved.  4. Hyponatremia-likely from dehydration from GI losses.  Today sodium is 129.   He should follow-up with nephrology in 3 weeks.  5. Hypertension-blood pressure is elevated, will restart amlodipine.    Lisinopril/HCTZ will be discontinued due to renal insufficiency.  Will start metoprolol 25 mg p.o. twice daily.  6. Diabetes mellitus type 2-CBGs elevated, continue home regimen including NPH insulin, regular insulin.   Procedures:    Consultations:    Discharge Exam: Vitals:   12/02/20 0755 12/02/20 1145  BP: (!) 154/94 (!) 150/101  Pulse: (!) 102 100  Resp:  15  Temp: 98.3 F (36.8 C) 97.9 F (36.6 C)  SpO2: 100% 100%    General: Appears in no acute distress Cardiovascular: S1-S2, regular Respiratory: Clear to auscultation bilaterally  Discharge Instructions   Discharge Instructions    Diet - low sodium heart healthy   Complete by: As directed    Discharge instructions   Complete by: As directed    Do not take ibuprofen, Aleve, Motrin, Advil for pain.  Take only Tylenol for pain as needed.  Drink at least 40 ounces of fluid every day.  Follow-up kidney specialist in 3 weeks.   Increase activity slowly   Complete by: As directed      Allergies as of 12/02/2020   No Known Allergies     Medication List    STOP taking these medications   lisinopril-hydrochlorothiazide 20-25 MG tablet Commonly known  as: ZESTORETIC     TAKE these medications   amLODipine 10 MG tablet Commonly known as: NORVASC Take 1 tablet by mouth daily   metoprolol tartrate 25 MG tablet Commonly known as: LOPRESSOR Take 1 tablet (25 mg total) by mouth 2 (two) times daily.      No Known Allergies  Follow-up Information    Grayce Sessions, NP In 2 weeks.   Specialty: Internal Medicine Why: Patient to make own follow up appt Contact information: [address] [place] [address] [phone]        Mady Haagensen, MD In 3 weeks.   Specialty: Nephrology Why: Patient to make own follow up appt Contact information: [address] [place] [address] [phone]                The results of significant diagnostics from this hospitalization (including imaging, microbiology, ancillary and laboratory) are listed below for reference.    Significant Diagnostic Studies: US Renal  Result Date: 11/30/2020 CLINICAL DATA:  Acute kidney injury in this 40 year old male. EXAM: RENAL / URINARY TRACT ULTRASOUND COMPLETE COMPARISON:  None FINDINGS: Right Kidney: Renal measurements: 11.5 x 4.9 x 5.9 cm = volume: 173 mL. Increased cortical echogenicity without signs of lesion, hydronephrosis or visible calculus. No perinephric fluid. Left Kidney: Renal measurements: 12.3 x 7.3 x 6.7 cm = volume: 316 mL. Increased cortical echogenicity without signs of lesion, hydronephrosis or visible calculus. No perinephric fluid. Bladder: Appears normal for degree of bladder distention. Other: None. IMPRESSION: Echogenic bilateral kidneys, as can be seen in the setting of medical renal disease. No hydronephrosis. Electronically Signed   By: Donzetta Kohut M.D.   On: 11/30/2020 16:22    Microbiology: Recent Results (from the past 240 hour(s))  Novel Coronavirus, NAA ([facility])     Status: None   Collection Time: 11/26/20  3:26 PM   Specimen: Nasopharyngeal(NP) swabs in vial transport medium   Nasopharynge  Screenin  Result Value Ref [place] Status   SARS-CoV-2, NAA Not Detected Not Detected Final    Comment: This nucleic acid amplification test was developed and its performance characteristics determined by [employer]. Nucleic acid amplification tests include RT-PCR and TMA. This test has not been FDA cleared or approved. This test has been authorized by FDA under an Emergency Use Authorization (EUA). This test is only authorized for  the duration of time the declaration that circumstances exist justifying the authorization of the emergency use of in vitro diagnostic tests for detection of SARS-CoV-2 virus and/or diagnosis of COVID-19 infection under section 564(b)(1) of the Act, 21 U.S.C. 962IWL-7(L) (1), unless the authorization is terminated or revoked sooner. When diagnostic testing is negative, the possibility of a false negative result should be considered in the context of a patient's recent exposures and the presence of clinical signs and symptoms consistent with COVID-19. An individual without symptoms of COVID-19 and who is not shedding SARS-CoV-2 virus wo uld expect to have a negative (not detected) result in this assay.   SARS-COV-2, NAA 2 DAY TAT     Status: None   Collection Time: 11/26/20  3:26 PM   Nasopharynge  Screenin  Result Value Ref Range Status   SARS-CoV-2, NAA 2 DAY TAT Performed  Final  SARS Coronavirus 2 by RT PCR (hospital order, performed in [facility] hospital lab) Nasopharyngeal Nasopharyngeal Swab     Status: Abnormal   Collection Time: 11/30/20  4:22 PM   Specimen: Nasopharyngeal Swab  Result Value Ref  Range Status   SARS Coronavirus 2 POSITIVE (A) NEGATIVE Final    Comment: RESULT CALLED TO, READ BACK BY AND VERIFIED WITH: KENDALL FLANNAGAN AT 1827 ON 11/30/20 BY SS (NOTE) SARS-CoV-2 target nucleic acids are DETECTED  SARS-CoV-2 RNA is generally detectable in upper respiratory specimens  during the acute phase of infection.  Positive results are indicative  of the presence of the identified virus, but do not rule out bacterial infection or co-infection with other pathogens not detected by the test.  Clinical correlation with patient history and  other diagnostic information is necessary to determine patient infection status.  The expected result is negative.  Fact Sheet for Patients:   BoilerBrush.com.cy   Fact Sheet for Healthcare Providers:    https://pope.com/    This test is not yet approved or cleared by the [place] FDA and  has been authorized for detection and/or diagnosis of SARS-CoV-2 by FDA under an Emergency Use Authorization (EUA).  This EUA will remain in effect (meaning  this test can be used) for the duration of  the COVID-19 declaration under Section 564(b)(1) of the Act, 21 U.S.C. section 360-bbb-3(b)(1), unless the authorization is terminated or revoked sooner.  Performed at [facility], [address] [place], [place] [address]      Labs: Basic Metabolic Panel: Recent Labs  Lab 11/30/20 1208 12/01/20 0704 12/02/20 0328  NA 129* 135 129*  K 4.6 4.0 4.4  CL 93* 102 101  CO2 22 25 19*  GLUCOSE 433* 165* 419*  BUN 79* 59* 46*  CREATININE 3.74* 2.87* 2.55*  CALCIUM 8.0* 7.5* 7.3*   Liver Function Tests: Recent Labs  Lab 11/30/20 1208  AST 40  ALT 23  ALKPHOS 55  BILITOT 0.8  PROT 6.5  ALBUMIN 2.9*   Recent Labs  Lab 11/30/20 1208  LIPASE 33   No results for input(s): AMMONIA in the last 168 hours. CBC: Recent Labs  Lab 11/30/20 1208 12/01/20 0704 12/02/20 0328  WBC 7.7 7.3 8.7  HGB 14.4 12.1* 12.2*  HCT 41.6 34.8* 35.4*  MCV 86.8 86.8 87.2  PLT 213 226 255    CBG: Recent Labs  Lab 12/01/20 1147 12/01/20 1637 12/01/20 2145 12/02/20 0757 12/02/20 1144  GLUCAP 278* 185* 343* 386* 247*       Signed:  Meredeth Ide MD.  [facility] 12/04/2020, 4:09 PM

## 2020-12-13 ENCOUNTER — Other Ambulatory Visit: Payer: Self-pay

## 2020-12-13 ENCOUNTER — Telehealth (INDEPENDENT_AMBULATORY_CARE_PROVIDER_SITE_OTHER): Payer: BLUE CROSS/BLUE SHIELD | Admitting: Primary Care

## 2020-12-13 ENCOUNTER — Encounter (INDEPENDENT_AMBULATORY_CARE_PROVIDER_SITE_OTHER): Payer: Self-pay | Admitting: Primary Care

## 2020-12-13 DIAGNOSIS — U071 COVID-19: Secondary | ICD-10-CM

## 2020-12-13 DIAGNOSIS — Z09 Encounter for follow-up examination after completed treatment for conditions other than malignant neoplasm: Secondary | ICD-10-CM

## 2020-12-13 DIAGNOSIS — N179 Acute kidney failure, unspecified: Secondary | ICD-10-CM | POA: Diagnosis not present

## 2020-12-13 NOTE — Progress Notes (Signed)
I connected with  Vincent Mcfarland on 12/13/20 by a video enabled telemedicine application and verified that I am speaking with the correct person using two identifiers.   I discussed the limitations of evaluation and management by telemedicine. The patient expressed understanding and agreed to proceed.    Pt still has swelling in ankles  No longer having covid symptoms

## 2020-12-13 NOTE — Progress Notes (Signed)
Telephone Note  I connected with Vincent Mcfarland on 12/13/20 at  2:30 PM EST by telephone and verified that I am speaking with the correct person using two identifiers.  Location: Patient: home Provider: Kinnie Scales [email or web address]    I discussed the limitations, risks, security and privacy concerns of performing an evaluation and management service by telephone and the availability of in person appointments. I also discussed with the patient that there may be a patient responsible charge related to this service. The patient expressed understanding and agreed to proceed.   History of Present Illness: [name] 40 y.o.male presents for follow up from the hospital. Admit date to the hospital was 11/30/20, patient was discharged from the hospital on 12/02/20, patient was admitted for: Nausea/vomiting/Weakness and COVID positive . He states all symptoms have resolved but was concern about his kidneys. Per discharge f/u with nephrology  will refer.  Past Medical History:  Diagnosis Date  . Diabetes mellitus without complication (HCC)   . Hypertension    Current Outpatient Medications on File Prior to Visit  Medication Sig Dispense Refill  . amLODipine (NORVASC) 10 MG tablet Take 1 tablet by mouth daily    . insulin NPH Human (NOVOLIN N) 100 UNIT/ML injection Inject 0.25 mLs (25 Units total) into the skin 2 (two) times daily before a meal. 10 mL 3  . insulin regular (NOVOLIN R) 100 units/mL injection Inject 0.15 mLs (15 Units total) into the skin 2 (two) times daily before a meal. 10 mL 3  . metoprolol tartrate (LOPRESSOR) 25 MG tablet Take 1 tablet (25 mg total) by mouth 2 (two) times daily. 60 tablet 11  . atorvastatin (LIPITOR) 40 MG tablet Take by mouth. (Patient not taking: Reported on 12/13/2020)    . lisinopril-hydrochlorothiazide (ZESTORETIC) 20-25 MG tablet Take 1 tablet by mouth daily. (Patient not taking: Reported on 12/13/2020)     No current facility-administered medications on file  prior to visit.   Observations/Objective: VS not taken ROS negative   Assessment and Plan: Vincent Mcfarland was seen today for hospitalization follow-up.  Diagnoses and all orders for this visit:  Hospital discharge follow-up retrieved from discharge summary  1 Follow up with Randa Evens Kinnie Scales, NP (Internal Medicine) in 2 weeks (12/17/2020); Patient to make own follow up appt Completed  Follow up with Mady Haagensen, MD (Nephrology) in 3 weeks (12/24/2020);  Refer patient to nephrology    AKI (acute kidney injury) (HCC)  acute kidney injury on CKD stage II with creatinine 3.74 felt from GI loss from nausea vomiting from COVID-19 infection. Do not take any NSAIDS.   -     Ambulatory referral to Nephrology  COVID I connected by phone with Vincent Mcfarland on 12/13/2020  For hospital f/u and any problems or symptoms after discharge. He asked when could he get the vaccine - explained 90 days after having COVID very anxious to be vaccinated .    Alice Reichert, NP 12/13/2020 5:29 PM   Follow Up Instructions:    I discussed the assessment and treatment plan with the patient. The patient was provided an opportunity to ask questions and all were answered. The patient agreed with the plan and demonstrated an understanding of the instructions.   The patient was advised to call back or seek an in-person evaluation if the symptoms worsen or if the condition fails to improve as anticipated.  I provided 25   minutes of non-face-to-face time during this encounter.  Includes reviewing hospital notes , labs and imaging  Kerin Perna, NP

## 2020-12-25 DIAGNOSIS — E109 Type 1 diabetes mellitus without complications: Secondary | ICD-10-CM | POA: Diagnosis not present

## 2020-12-28 ENCOUNTER — Telehealth: Payer: Self-pay | Admitting: Primary Care

## 2020-12-28 NOTE — Telephone Encounter (Signed)
Copied from CRM [id number]. Topic: General - Other >> Dec 28, 2020  1:54 PM Elliot Gault wrote: Patient aware referral sent to the kidney specialist but patient states there should be a referral to a endocrinologist in [place], please follow up with patient directly

## 2020-12-31 NOTE — Telephone Encounter (Signed)
Please advise and/or place referral.

## 2021-01-01 ENCOUNTER — Other Ambulatory Visit (INDEPENDENT_AMBULATORY_CARE_PROVIDER_SITE_OTHER): Payer: Self-pay | Admitting: Primary Care

## 2021-01-01 DIAGNOSIS — E119 Type 2 diabetes mellitus without complications: Secondary | ICD-10-CM

## 2021-01-01 DIAGNOSIS — Z794 Long term (current) use of insulin: Secondary | ICD-10-CM

## 2021-01-07 DIAGNOSIS — N182 Chronic kidney disease, stage 2 (mild): Secondary | ICD-10-CM | POA: Diagnosis not present

## 2021-01-07 DIAGNOSIS — N179 Acute kidney failure, unspecified: Secondary | ICD-10-CM | POA: Diagnosis not present

## 2021-01-07 DIAGNOSIS — E1122 Type 2 diabetes mellitus with diabetic chronic kidney disease: Secondary | ICD-10-CM | POA: Diagnosis not present

## 2021-01-25 DIAGNOSIS — E109 Type 1 diabetes mellitus without complications: Secondary | ICD-10-CM | POA: Diagnosis not present

## 2021-02-24 DIAGNOSIS — E109 Type 1 diabetes mellitus without complications: Secondary | ICD-10-CM | POA: Diagnosis not present

## 2021-02-26 ENCOUNTER — Ambulatory Visit: Payer: BLUE CROSS/BLUE SHIELD | Admitting: Endocrinology

## 2021-03-27 DIAGNOSIS — E109 Type 1 diabetes mellitus without complications: Secondary | ICD-10-CM | POA: Diagnosis not present

## 2021-04-26 DIAGNOSIS — E109 Type 1 diabetes mellitus without complications: Secondary | ICD-10-CM | POA: Diagnosis not present

## 2021-04-30 ENCOUNTER — Other Ambulatory Visit: Payer: Self-pay

## 2021-04-30 ENCOUNTER — Encounter: Payer: Self-pay | Admitting: Endocrinology

## 2021-04-30 ENCOUNTER — Ambulatory Visit (INDEPENDENT_AMBULATORY_CARE_PROVIDER_SITE_OTHER): Payer: BLUE CROSS/BLUE SHIELD | Admitting: Endocrinology

## 2021-04-30 VITALS — BP 164/90 | HR 93 | Ht 72.0 in | Wt 217.6 lb

## 2021-04-30 DIAGNOSIS — E119 Type 2 diabetes mellitus without complications: Secondary | ICD-10-CM | POA: Diagnosis not present

## 2021-04-30 DIAGNOSIS — R739 Hyperglycemia, unspecified: Secondary | ICD-10-CM

## 2021-04-30 DIAGNOSIS — Z79899 Other long term (current) drug therapy: Secondary | ICD-10-CM | POA: Diagnosis not present

## 2021-04-30 LAB — BASIC METABOLIC PANEL
BUN: 27 mg/dL — ABNORMAL HIGH (ref 6–23)
CO2: 28 mEq/L (ref 19–32)
Calcium: 9.1 mg/dL (ref 8.4–10.5)
Chloride: 96 mEq/L (ref 96–112)
Creatinine, Ser: 1.94 mg/dL — ABNORMAL HIGH (ref 0.40–1.50)
GFR: 42.19 mL/min — ABNORMAL LOW (ref >60.00)
Glucose, Bld: 425 mg/dL — ABNORMAL HIGH (ref 70–99)
Potassium: 4.7 mEq/L (ref 3.5–5.1)
Sodium: 132 mEq/L — ABNORMAL LOW (ref 135–145)

## 2021-04-30 LAB — T4, FREE: Free T4: 0.79 ng/dL (ref 0.60–1.60)

## 2021-04-30 LAB — LIPID PANEL
Cholesterol: 196 mg/dL (ref 0–200)
HDL: 38.4 mg/dL — ABNORMAL LOW (ref >39.00)
NonHDL: 157.37
Total CHOL/HDL Ratio: 5
Triglycerides: 208 mg/dL — ABNORMAL HIGH (ref 0.0–149.0)
VLDL: 41.6 mg/dL — ABNORMAL HIGH (ref 0.0–40.0)

## 2021-04-30 LAB — POCT GLYCOSYLATED HEMOGLOBIN (HGB A1C): Hemoglobin A1C: 10.7 % — AB (ref 4.0–5.6)

## 2021-04-30 LAB — TSH: TSH: 1.69 u[IU]/mL (ref 0.35–5.50)

## 2021-04-30 LAB — LDL CHOLESTEROL, DIRECT: Direct LDL: 100 mg/dL

## 2021-04-30 MED ORDER — INSULIN NPH (HUMAN) (ISOPHANE) 100 UNIT/ML ~~LOC~~ SUSP: 60.0000 [IU] | SUBCUTANEOUS | 3 refills | Status: DC

## 2021-04-30 NOTE — Patient Instructions (Addendum)
good diet and exercise significantly improve the control of your diabetes.  please let me know if you wish to be referred to a dietician.  high blood sugar is very risky to your health.  you should see an eye doctor and dentist every year.  It is very important to get all recommended vaccinations.  Controlling your blood pressure and cholesterol drastically reduces the damage diabetes does to your body.  Those who smoke should quit.  Please discuss these with your doctor.  check your blood sugar twice a day.  vary the time of day when you check, between before the 3 meals, and at bedtime.  also check if you have symptoms of your blood sugar being too high or too low.  please keep a record of the readings and bring it to your next appointment here (or you can bring the meter itself).  You can write it on any piece of paper.  please call us sooner if your blood sugar goes below 70, or if most of your readings are over 200.   Please stop taking the Reg insulin, and take NPH, 60 units in the morning, and none in the evening. On this type of insulin schedule, you should eat meals on a regular schedule.  If a meal is missed or significantly delayed, your blood sugar could go low.   Please come back for a follow-up appointment in 2 months.  Blood tests are requested for you today.  We'll let you know about the results.  Please call or message [place] next week, to tell us how the blood sugar is doing.

## 2021-04-30 NOTE — Progress Notes (Signed)
Subjective:    Patient ID: Vincent Mcfarland, male    DOB: 08-13-1981, 40 y.o.   MRN: [id number]  HPI pt is referred by Gwinda Passe, NP, for diabetes.  Pt states DM was dx'ed in 1989; it is complicated by DR and stage 3 CRI; he has been on insulin since dx; pt says his diet and exercise are good; he has never had pancreatitis, pancreatic surgery, severe hypoglycemia or DKA.  He takes multiple daily injections.  He says cbg varies from 45-600.  He has hypoglycemia approx QOD.  This usually happens in the early AM hrs.  Pt says he misses the insulin approx 3-4 times per week.  He often skips lunch.   Past Medical History:  Diagnosis Date   Diabetes mellitus without complication (HCC)    Hypertension     Past Surgical History:  Procedure Laterality Date   EYE SURGERY      Social History   Socioeconomic History   Marital status: Married    Spouse name: Not on file   Number of children: Not on file   Years of education: Not on file   Highest education level: Not on file  Occupational History   Not on file  Tobacco Use   Smoking status: Never   Smokeless tobacco: Never  Vaping Use   Vaping Use: Never used  Substance and Sexual Activity   Alcohol use: No   Drug use: No   Sexual activity: Not on file  Other Topics Concern   Not on file  Social History Narrative   Not on file   Social Determinants of Health   Financial Resource Strain: Not on file  Food Insecurity: Not on file  Transportation Needs: Not on file  Physical Activity: Not on file  Stress: Not on file  Social Connections: Not on file  Intimate Partner Violence: Not on file    Current Outpatient Medications on File Prior to Visit  Medication Sig Dispense Refill   amLODipine (NORVASC) 10 MG tablet Take 1 tablet by mouth daily     atorvastatin (LIPITOR) 40 MG tablet Take by mouth.     lisinopril-hydrochlorothiazide (ZESTORETIC) 20-25 MG tablet Take 1 tablet by mouth daily.     metoprolol tartrate  (LOPRESSOR) 25 MG tablet Take 1 tablet (25 mg total) by mouth 2 (two) times daily. 60 tablet 11   No current facility-administered medications on file prior to visit.    No Known Allergies  Family History  Problem Relation Age of Onset   Healthy Mother    Aneurysm Father        brain   Hypertension Father    Diabetes Neg Hx     BP (!) 164/90 (BP Location: Right Arm, Patient Position: Sitting, Cuff Size: Large)   Pulse 93   Ht 6' (1.829 m)   Wt 217 lb 9.6 oz (98.7 kg)   SpO2 97%   BMI 29.51 kg/m     Review of Systems denies weight loss, sob, and n/v.      Objective:   Physical Exam Pulses: dorsalis pedis intact bilat.   MSK: no deformity of the feet CV: no leg edema Skin:  no ulcer on the feet.  normal color and temp on the feet.  Neuro: sensation is intact to touch on the feet.    Lab Results  Component Value Date   CREATININE 2.55 (H) 12/02/2020   BUN 46 (H) 12/02/2020   NA 129 (L) 12/02/2020   K 4.4 12/02/2020  CL 101 12/02/2020   CO2 19 (L) 12/02/2020   A1c=10.7%  I have reviewed outside records, and summarized: Pt was noted to have elevated A1c, and referred here.  He was admitted early 2022 for n/v and AKI.  However, he did not have DKA.      Assessment & Plan:  Insulin-requiring type 2 DM: uncontrolled.   Lean body habitus.   Noncompliance with insulin: we'll try changing to QD dosing CRI: in this setting, he has long duration of action of insulin.    Patient Instructions  good diet and exercise significantly improve the control of your diabetes.  please let me know if you wish to be referred to a dietician.  high blood sugar is very risky to your health.  you should see an eye doctor and dentist every year.  It is very important to get all recommended vaccinations.  Controlling your blood pressure and cholesterol drastically reduces the damage diabetes does to your body.  Those who smoke should quit.  Please discuss these with your doctor.  check  your blood sugar twice a day.  vary the time of day when you check, between before the 3 meals, and at bedtime.  also check if you have symptoms of your blood sugar being too high or too low.  please keep a record of the readings and bring it to your next appointment here (or you can bring the meter itself).  You can write it on any piece of paper.  please call us sooner if your blood sugar goes below 70, or if most of your readings are over 200.   Please stop taking the Reg insulin, and take NPH, 60 units in the morning, and none in the evening. On this type of insulin schedule, you should eat meals on a regular schedule.  If a meal is missed or significantly delayed, your blood sugar could go low.   Please come back for a follow-up appointment in 2 months.  Blood tests are requested for you today.  We'll let you know about the results.  Please call or message [place] next week, to tell us how the blood sugar is doing.

## 2021-05-27 DIAGNOSIS — E109 Type 1 diabetes mellitus without complications: Secondary | ICD-10-CM | POA: Diagnosis not present

## 2021-06-27 DIAGNOSIS — E109 Type 1 diabetes mellitus without complications: Secondary | ICD-10-CM | POA: Diagnosis not present

## 2021-07-15 ENCOUNTER — Ambulatory Visit: Payer: BLUE CROSS/BLUE SHIELD | Admitting: Endocrinology

## 2021-07-25 IMAGING — US US RENAL
1 series · 15 of 25 positions shown · non-contrast
Comparison: None

CLINICAL DATA: Acute kidney injury in this 39-year-old male.

EXAM:
RENAL / URINARY TRACT ULTRASOUND COMPLETE

[Series 1: us renal · 15 of 39 slices shown]
[im 1/39]
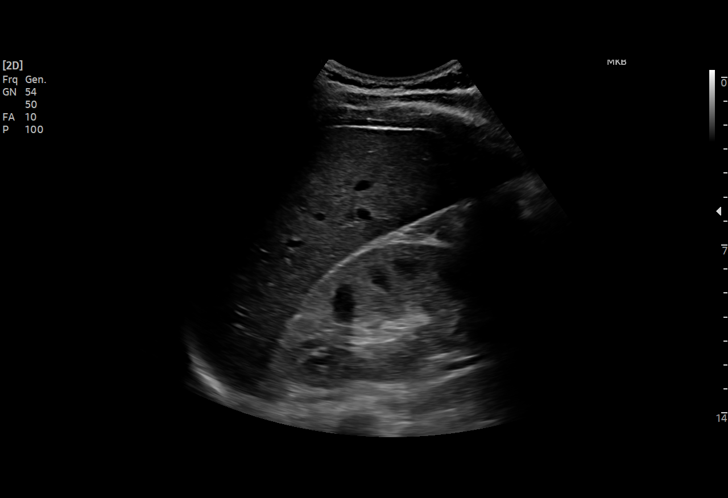
[im 4/39]
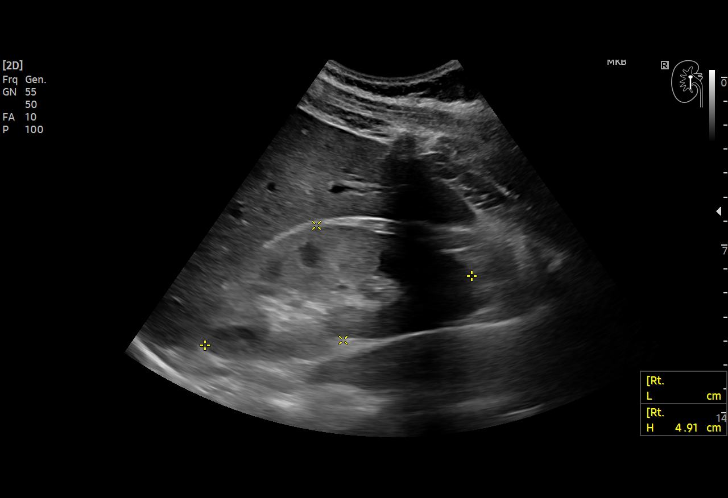
[im 7/39]
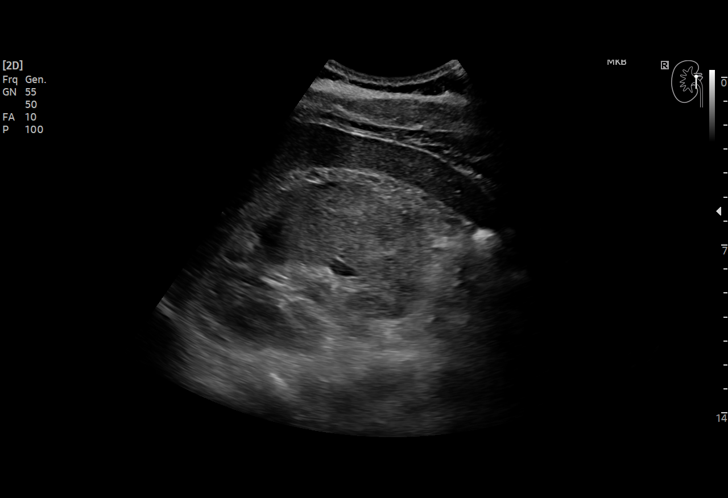
[im 8/39]
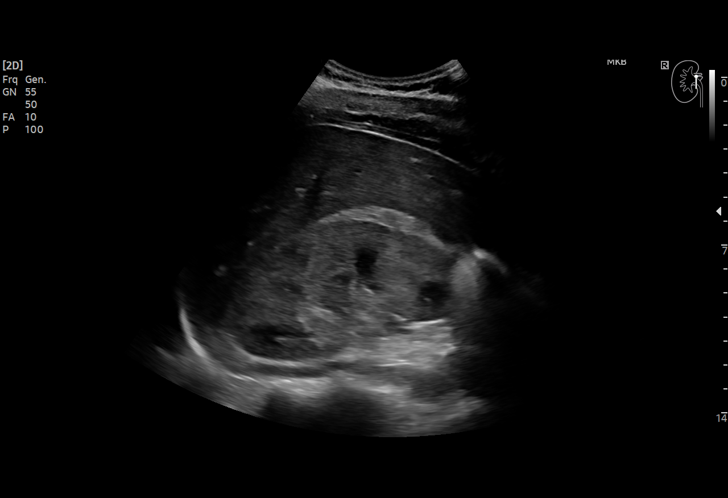
[im 12/39]
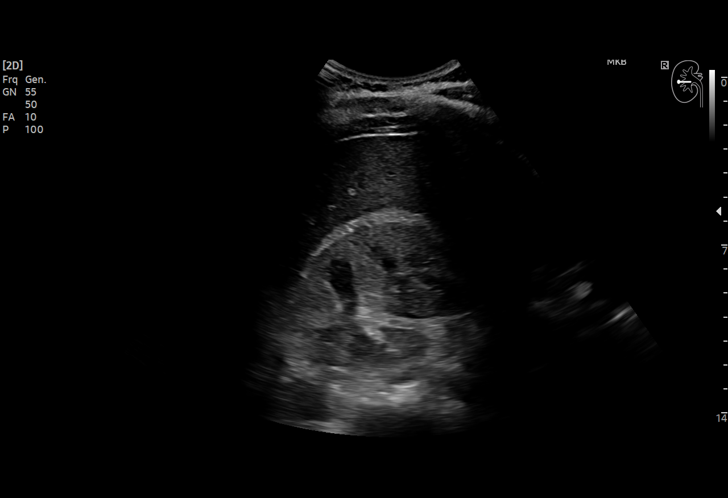
[im 15/39]
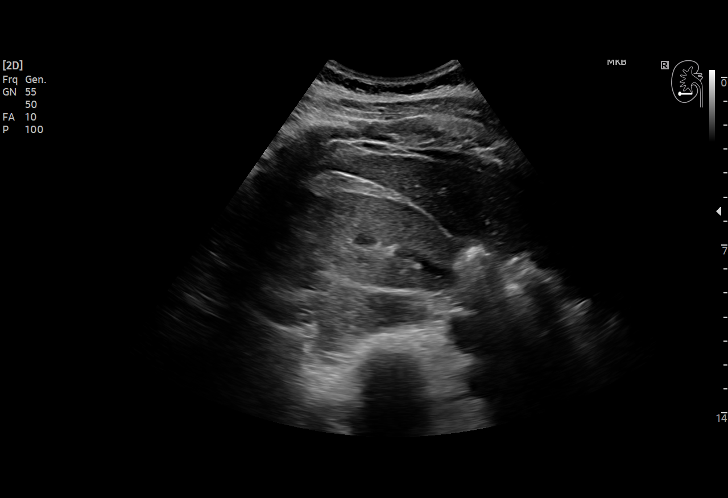
[im 16/39]
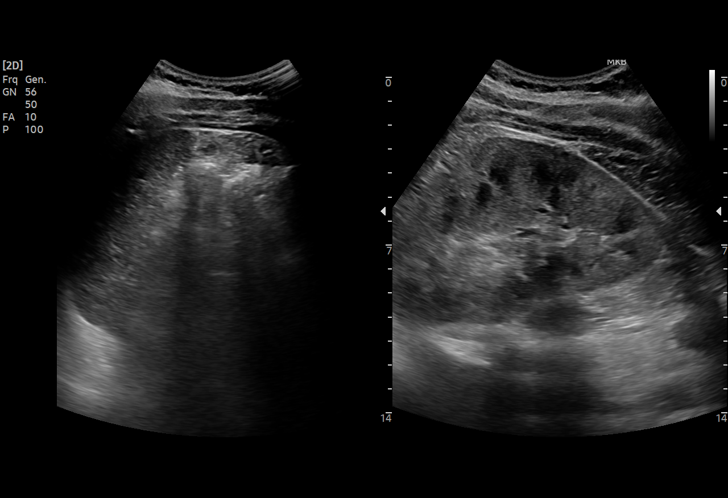
[im 20/39]
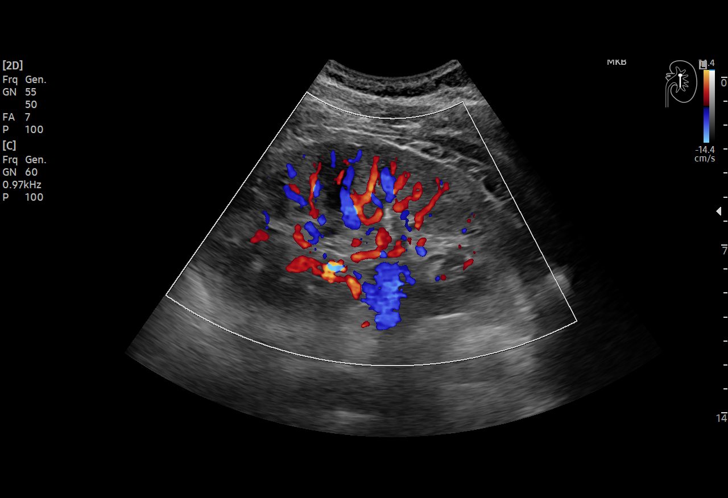
[im 23/39]
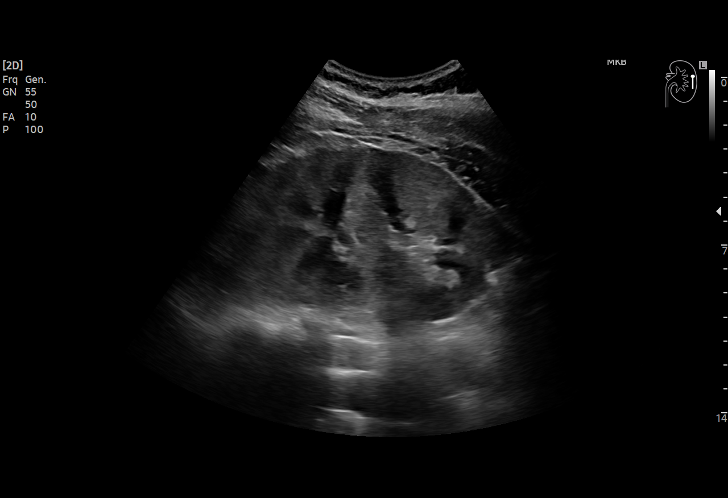
[im 24/39]
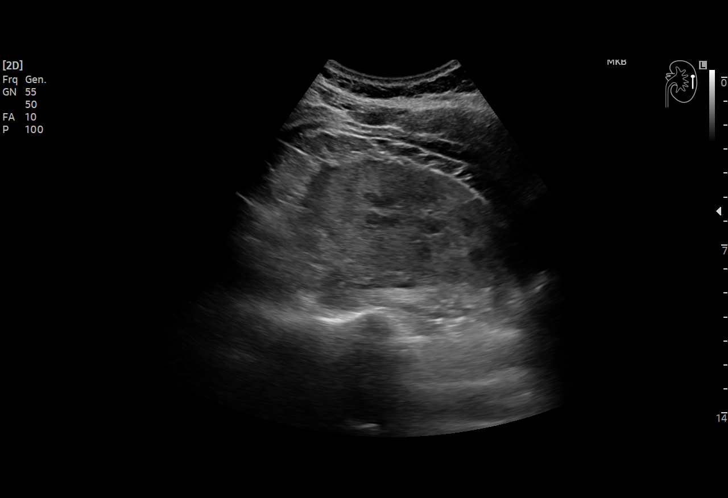
[im 27/39]
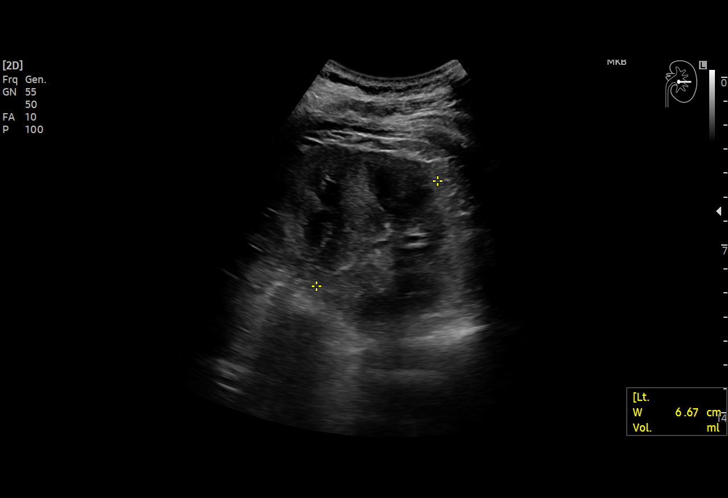
[im 31/39]
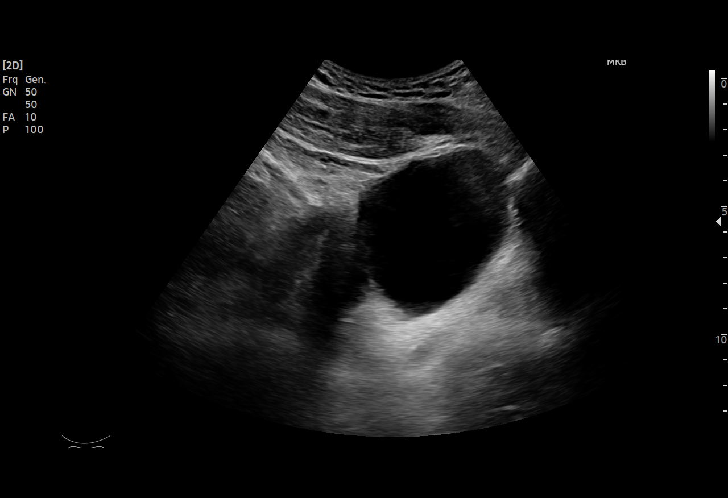
[im 32/39]
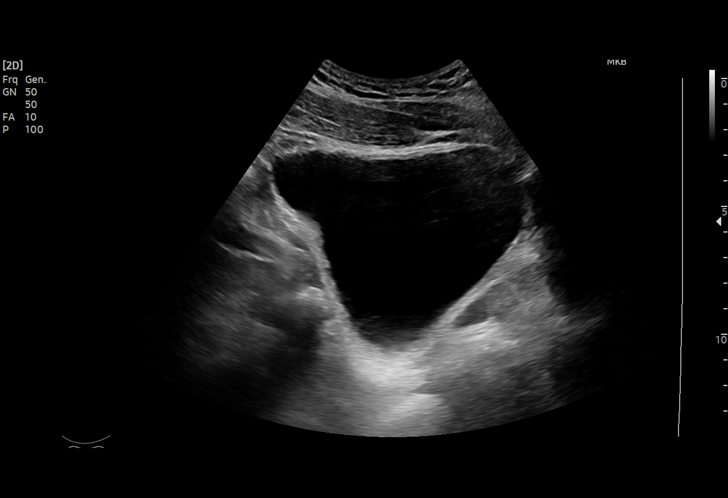
[im 35/39]
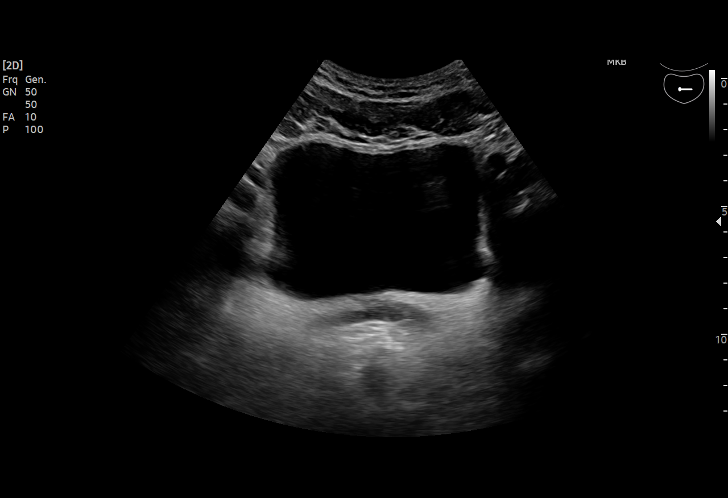
[im 39/39]
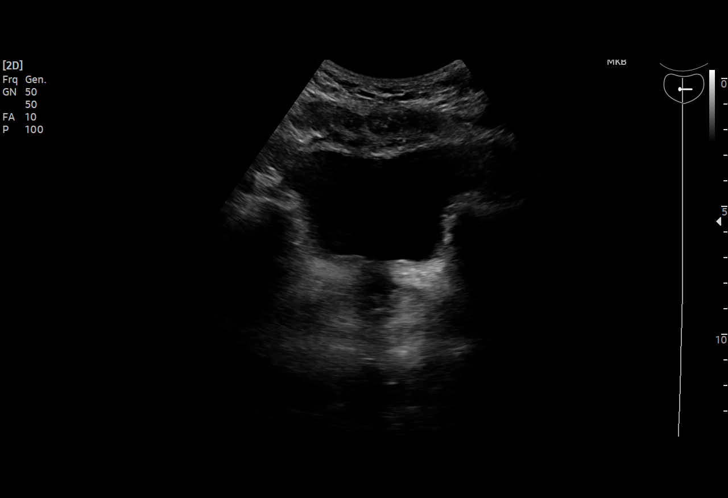

[15 of 25 positions shown; findings below may reference images not displayed]

FINDINGS: Right Kidney:

Renal measurements: 11.5 x 4.9 x 5.9 cm = volume: 173 mL. Increased
cortical echogenicity without signs of lesion, hydronephrosis or
visible calculus. No perinephric fluid.

Left Kidney:

Renal measurements: 12.3 x 7.3 x 6.7 cm = volume: 316 mL. Increased
cortical echogenicity without signs of lesion, hydronephrosis or
visible calculus. No perinephric fluid.

Bladder:

Appears normal for degree of bladder distention.

Other:

None.
IMPRESSION: Echogenic bilateral kidneys, as can be seen in the setting of
medical renal disease. No hydronephrosis.

## 2021-07-27 DIAGNOSIS — E109 Type 1 diabetes mellitus without complications: Secondary | ICD-10-CM | POA: Diagnosis not present

## 2021-08-27 DIAGNOSIS — E109 Type 1 diabetes mellitus without complications: Secondary | ICD-10-CM | POA: Diagnosis not present

## 2021-09-26 DIAGNOSIS — E109 Type 1 diabetes mellitus without complications: Secondary | ICD-10-CM | POA: Diagnosis not present

## 2021-10-27 DIAGNOSIS — E109 Type 1 diabetes mellitus without complications: Secondary | ICD-10-CM | POA: Diagnosis not present

## 2021-11-27 DIAGNOSIS — E109 Type 1 diabetes mellitus without complications: Secondary | ICD-10-CM | POA: Diagnosis not present

## 2021-12-25 DIAGNOSIS — E109 Type 1 diabetes mellitus without complications: Secondary | ICD-10-CM | POA: Diagnosis not present

## 2021-12-27 ENCOUNTER — Ambulatory Visit (INDEPENDENT_AMBULATORY_CARE_PROVIDER_SITE_OTHER): Payer: BLUE CROSS/BLUE SHIELD | Admitting: Endocrinology

## 2021-12-27 ENCOUNTER — Other Ambulatory Visit: Payer: Self-pay

## 2021-12-27 VITALS — BP 180/110 | HR 96 | Ht 72.0 in | Wt 229.2 lb

## 2021-12-27 DIAGNOSIS — E1165 Type 2 diabetes mellitus with hyperglycemia: Secondary | ICD-10-CM | POA: Diagnosis not present

## 2021-12-27 DIAGNOSIS — R739 Hyperglycemia, unspecified: Secondary | ICD-10-CM

## 2021-12-27 DIAGNOSIS — E119 Type 2 diabetes mellitus without complications: Secondary | ICD-10-CM

## 2021-12-27 LAB — POCT GLYCOSYLATED HEMOGLOBIN (HGB A1C): Hemoglobin A1C: 10.3 % — AB (ref 4.0–5.6)

## 2021-12-27 MED ORDER — FREESTYLE LIBRE 2 SENSOR MISC
1.0000 | 3 refills | Status: DC
Start: 2021-12-27 — End: 2022-04-01

## 2021-12-27 MED ORDER — INSULIN NPH (HUMAN) (ISOPHANE) 100 UNIT/ML ~~LOC~~ SUSP: 75.0000 [IU] | SUBCUTANEOUS | 3 refills | Status: DC

## 2021-12-27 NOTE — Patient Instructions (Addendum)
Your blood pressure is high today.  Please see your primary care provider soon, to have it rechecked ?check your blood sugar twice a day.  vary the time of day when you check, between before the 3 meals, and at bedtime.  also check if you have symptoms of your blood sugar being too high or too low.  please keep a record of the readings and bring it to your next appointment here (or you can bring the meter itself).  You can write it on any piece of paper.  please call us sooner if your blood sugar goes below 70, or if most of your readings are over 200.   ?Please stop taking the Reg insulin, and take NPH, 75 units in the morning, and none in the evening.   ?On this type of insulin schedule, you should eat meals on a regular schedule.  If a meal is missed or significantly delayed, your blood sugar could go low.   ?Please come back for a follow-up appointment in 1 month.   ? ?

## 2021-12-27 NOTE — Progress Notes (Signed)
? ?Subjective:  ? ? Patient ID: Vincent Mcfarland, male    DOB: [birth year], 41 y.o.   MRN: [id number] ? ?HPI ?Pt returns for f/u of diabetes mellitus: ?DM type: Insulin-requiring type 2 ?Dx'ed: 1989 ?Complications: DR and stage 3 CRI ?Therapy: insulin since dx ?DKA: never ?Severe hypoglycemia: never ?Pancreatitis: never ?Pancreatic imaging: none known ?SDOH: he was changed to QD insulin, after poor results with multiple daily injections; He often skips lunch. ?Other: he has long duration of action of insulin, usually due to CRI ?Interval history: no cbg record, but states cbg's vary from 40-500. It is in general higher as the day goes on.  He often takes NPH insulin in the evening, when cbg is high.  He still intermittently takes reg insulin.  Pt says he sometimes misses the insulin and BP meds.   ?Past Medical History:  ?Diagnosis Date  ? Diabetes mellitus without complication ([place])   ? Hypertension   ? ? ?Past Surgical History:  ?Procedure Laterality Date  ? EYE SURGERY    ? ? ?Social History  ? ?Socioeconomic History  ? Marital status: Married  ?  Spouse name: Not on file  ? Number of children: Not on file  ? Years of education: Not on file  ? Highest education level: Not on file  ?Occupational History  ? Not on file  ?Tobacco Use  ? Smoking status: Never  ? Smokeless tobacco: Never  ?Vaping Use  ? Vaping Use: Never used  ?Substance and Sexual Activity  ? Alcohol use: No  ? Drug use: No  ? Sexual activity: Not on file  ?Other Topics Concern  ? Not on file  ?Social History Narrative  ? Not on file  ? ?Social Determinants of Health  ? ?Financial Resource Strain: Not on file  ?Food Insecurity: Not on file  ?Transportation Needs: Not on file  ?Physical Activity: Not on file  ?Stress: Not on file  ?Social Connections: Not on file  ?Intimate Partner Violence: Not on file  ? ? ?Current Outpatient Medications on File Prior to Visit  ?Medication Sig Dispense Refill  ? amLODipine (NORVASC) 10 MG tablet Take 1 tablet by mouth  daily    ? atorvastatin (LIPITOR) 40 MG tablet Take by mouth.    ? lisinopril-hydrochlorothiazide (ZESTORETIC) 20-25 MG tablet Take 1 tablet by mouth daily.    ? metoprolol tartrate (LOPRESSOR) 25 MG tablet Take 1 tablet (25 mg total) by mouth 2 (two) times daily. 60 tablet 11  ? ?No current facility-administered medications on file prior to visit.  ? ? ?No Known Allergies ? ?Family History  ?Problem Relation Age of Onset  ? Healthy Mother   ? Aneurysm Father   ?     brain  ? Hypertension Father   ? Diabetes Neg Hx   ? ? ?BP (!) 180/110   Pulse 96   Ht 6' (1.829 m)   Wt 229 lb 3.2 oz (104 kg)   SpO2 97%   BMI 31.09 kg/m?  ? ? ?Review of Systems ? ?   ?Objective:  ? Physical Exam ? ? ? ?Lab Results  ?Component Value Date  ? HGBA1C 10.3 (A) 12/27/2021  ? ?   ?Assessment & Plan:  ?Insulin-requiring type 2 DM: uncontrolled.   ? ?Patient Instructions  ?Your blood pressure is high today.  Please see your primary care provider soon, to have it rechecked ?check your blood sugar twice a day.  vary the time of day when you check, between before the  3 meals, and at bedtime.  also check if you have symptoms of your blood sugar being too high or too low.  please keep a record of the readings and bring it to your next appointment here (or you can bring the meter itself).  You can write it on any piece of paper.  please call us sooner if your blood sugar goes below 70, or if most of your readings are over 200.   ?Please stop taking the Reg insulin, and take NPH, 75 units in the morning, and none in the evening.   ?On this type of insulin schedule, you should eat meals on a regular schedule.  If a meal is missed or significantly delayed, your blood sugar could go low.   ?Please come back for a follow-up appointment in 1 month.   ? ? ? ?

## 2022-01-25 DIAGNOSIS — E109 Type 1 diabetes mellitus without complications: Secondary | ICD-10-CM | POA: Diagnosis not present

## 2022-02-24 ENCOUNTER — Ambulatory Visit: Payer: BLUE CROSS/BLUE SHIELD | Admitting: Endocrinology

## 2022-02-24 DIAGNOSIS — E109 Type 1 diabetes mellitus without complications: Secondary | ICD-10-CM | POA: Diagnosis not present

## 2022-02-28 ENCOUNTER — Other Ambulatory Visit (INDEPENDENT_AMBULATORY_CARE_PROVIDER_SITE_OTHER): Payer: Self-pay | Admitting: Primary Care

## 2022-02-28 NOTE — Telephone Encounter (Signed)
Medication Refill - Medication: amLODipine (NORVASC) 10 MG tablet  ? ?Has the patient contacted their pharmacy? No. ?(Agent: If no, request that the patient contact the pharmacy for the refill. If patient does not wish to contact the pharmacy document the reason why and proceed with request.) ?(Agent: If yes, when and what did the pharmacy advise?) ? ?Preferred Pharmacy (with phone number or street name):  ?[id number] [place], [place] - [address] AT [address] Phone:  [phone]  ?Fax:  [phone]  ?  ? ?Has the patient been seen for an appointment in the last year OR does the patient have an upcoming appointment? No. ? ?Agent: Please be advised that RX refills may take up to 3 business days. We ask that you follow-up with your pharmacy. ?

## 2022-02-28 NOTE — Telephone Encounter (Signed)
Requested medications are due for refill today.  unsure ? ?Requested medications are on the active medications list.  yes ? ?Last refill. 01/02/2019 ? ?Future visit scheduled.   yes ? ?Notes to clinic.  Medication is listed as historical - last refill 12/2018 ? ? ? ?Requested Prescriptions  ?Pending Prescriptions Disp Refills  ? amLODipine (NORVASC) 10 MG tablet    ?  Sig: Take 1 tablet (10 mg total) by mouth daily.  ?  ? Cardiovascular: Calcium Channel Blockers 2 Failed - 02/28/2022  1:26 PM  ?  ?  Failed - Last BP in normal range  ?  BP Readings from Last 1 Encounters:  ?12/27/21 (!) 180/110  ?  ?  ?  ?  Failed - Valid encounter within last 6 months  ?  Recent Outpatient Visits   ? ?      ? 1 year ago Hospital discharge follow-up  ? [facility] Grayce Sessions, NP  ? 1 year ago Encounter to establish care  ? [facility] Grayce Sessions, NP  ? ?  ?  ?Future Appointments   ? ?        ? In 3 days Grayce Sessions, NP [facility]  ? ?  ? ? ?  ?  ?  Passed - Last Heart Rate in normal range  ?  Pulse Readings from Last 1 Encounters:  ?12/27/21 96  ?  ?  ?  ?  ?  ?

## 2022-03-03 ENCOUNTER — Ambulatory Visit (INDEPENDENT_AMBULATORY_CARE_PROVIDER_SITE_OTHER): Payer: BC Managed Care – PPO | Admitting: Primary Care

## 2022-03-03 ENCOUNTER — Encounter (INDEPENDENT_AMBULATORY_CARE_PROVIDER_SITE_OTHER): Payer: Self-pay | Admitting: Primary Care

## 2022-03-03 VITALS — BP 182/104 | HR 102 | Temp 98.1°F | Ht 72.0 in | Wt 230.6 lb

## 2022-03-03 DIAGNOSIS — Z760 Encounter for issue of repeat prescription: Secondary | ICD-10-CM | POA: Diagnosis not present

## 2022-03-03 DIAGNOSIS — I10 Essential (primary) hypertension: Secondary | ICD-10-CM | POA: Diagnosis not present

## 2022-03-03 DIAGNOSIS — E782 Mixed hyperlipidemia: Secondary | ICD-10-CM | POA: Diagnosis not present

## 2022-03-03 MED ORDER — LISINOPRIL-HYDROCHLOROTHIAZIDE 20-25 MG PO TABS: 1.0000 | ORAL_TABLET | Freq: Every day | ORAL | 1 refills | Status: DC

## 2022-03-03 MED ORDER — AMLODIPINE BESYLATE 10 MG PO TABS: 10.0000 mg | ORAL_TABLET | Freq: Every day | ORAL | 1 refills | Status: DC

## 2022-03-03 NOTE — Patient Instructions (Signed)
Hypertension, Adult High blood pressure (hypertension) is when the force of blood pumping through the arteries is too strong. The arteries are the blood vessels that carry blood from the heart throughout the body. Hypertension forces the heart to work harder to pump blood and may cause arteries to become narrow or stiff. Untreated or uncontrolled hypertension can lead to a heart attack, heart failure, a stroke, kidney disease, and other problems. A blood pressure reading consists of a higher number over a lower number. Ideally, your blood pressure should be below 120/80. The first ("top") number is called the systolic pressure. It is a measure of the pressure in your arteries as your heart beats. The second ("bottom") number is called the diastolic pressure. It is a measure of the pressure in your arteries as the heart relaxes. What are the causes? The exact cause of this condition is not known. There are some conditions that result in high blood pressure. What increases the risk? Certain factors may make you more likely to develop high blood pressure. Some of these risk factors are under your control, including: Smoking. Not getting enough exercise or physical activity. Being overweight. Having too much fat, sugar, calories, or salt (sodium) in your diet. Drinking too much alcohol. Other risk factors include: Having a personal history of heart disease, diabetes, high cholesterol, or kidney disease. Stress. Having a family history of high blood pressure and high cholesterol. Having obstructive sleep apnea. Age. The risk increases with age. What are the signs or symptoms? High blood pressure may not cause symptoms. Very high blood pressure (hypertensive crisis) may cause: Headache. Fast or irregular heartbeats (palpitations). Shortness of breath. Nosebleed. Nausea and vomiting. Vision changes. Severe chest pain, dizziness, and seizures. How is this diagnosed? This condition is diagnosed by  measuring your blood pressure while you are seated, with your arm resting on a flat surface, your legs uncrossed, and your feet flat on the floor. The cuff of the blood pressure monitor will be placed directly against the skin of your upper arm at the level of your heart. Blood pressure should be measured at least twice using the same arm. Certain conditions can cause a difference in blood pressure between your right and left arms. If you have a high blood pressure reading during one visit or you have normal blood pressure with other risk factors, you may be asked to: Return on a different day to have your blood pressure checked again. Monitor your blood pressure at home for 1 week or longer. If you are diagnosed with hypertension, you may have other blood or imaging tests to help your health care provider understand your overall risk for other conditions. How is this treated? This condition is treated by making healthy lifestyle changes, such as eating healthy foods, exercising more, and reducing your alcohol intake. You may be referred for counseling on a healthy diet and physical activity. Your health care provider may prescribe medicine if lifestyle changes are not enough to get your blood pressure under control and if: Your systolic blood pressure is above 130. Your diastolic blood pressure is above 80. Your personal target blood pressure may vary depending on your medical conditions, your age, and other factors. Follow these instructions at home: Eating and drinking  Eat a diet that is high in fiber and potassium, and low in sodium, added sugar, and fat. An example of this eating plan is called the DASH diet. DASH stands for Dietary Approaches to Stop Hypertension. To eat this way: Eat   plenty of fresh fruits and vegetables. Try to fill one half of your plate at each meal with fruits and vegetables. Eat whole grains, such as whole-wheat pasta, brown rice, or whole-grain bread. Fill about one  fourth of your plate with whole grains. Eat or drink low-fat dairy products, such as skim milk or low-fat yogurt. Avoid fatty cuts of meat, processed or cured meats, and poultry with skin. Fill about one fourth of your plate with lean proteins, such as fish, chicken without skin, beans, eggs, or tofu. Avoid pre-made and processed foods. These tend to be higher in sodium, added sugar, and fat. Reduce your daily sodium intake. Many people with hypertension should eat less than 1,500 mg of sodium a day. Do not drink alcohol if: Your health care provider tells you not to drink. You are pregnant, may be pregnant, or are planning to become pregnant. If you drink alcohol: Limit how much you have to: 0-1 drink a day for women. 0-2 drinks a day for men. Know how much alcohol is in your drink. In the [place] one drink equals one 12 oz bottle of beer (355 mL), one 5 oz glass of wine (148 mL), or one 1 oz glass of hard liquor (44 mL). Lifestyle  Work with your health care provider to maintain a healthy body weight or to lose weight. Ask what an ideal weight is for you. Get at least 30 minutes of exercise that causes your heart to beat faster (aerobic exercise) most days of the week. Activities may include walking, swimming, or biking. Include exercise to strengthen your muscles (resistance exercise), such as Pilates or lifting weights, as part of your weekly exercise routine. Try to do these types of exercises for 30 minutes at least 3 days a week. Do not use any products that contain nicotine or tobacco. These products include cigarettes, chewing tobacco, and vaping devices, such as e-cigarettes. If you need help quitting, ask your health care provider. Monitor your blood pressure at home as told by your health care provider. Keep all follow-up visits. This is important. Medicines Take over-the-counter and prescription medicines only as told by your health care provider. Follow directions carefully. Blood  pressure medicines must be taken as prescribed. Do not skip doses of blood pressure medicine. Doing this puts you at risk for problems and can make the medicine less effective. Ask your health care provider about side effects or reactions to medicines that you should watch for. Contact a health care provider if you: Think you are having a reaction to a medicine you are taking. Have headaches that keep coming back (recurring). Feel dizzy. Have swelling in your ankles. Have trouble with your vision. Get help right away if you: Develop a severe headache or confusion. Have unusual weakness or numbness. Feel faint. Have severe pain in your chest or abdomen. Vomit repeatedly. Have trouble breathing. These symptoms may be an emergency. Get help right away. Call 911. Do not wait to see if the symptoms will go away. Do not drive yourself to the hospital. Summary Hypertension is when the force of blood pumping through your arteries is too strong. If this condition is not controlled, it may put you at risk for serious complications. Your personal target blood pressure may vary depending on your medical conditions, your age, and other factors. For most people, a normal blood pressure is less than 120/80. Hypertension is treated with lifestyle changes, medicines, or a combination of both. Lifestyle changes include losing weight, eating a healthy,   low-sodium diet, exercising more, and limiting alcohol. This information is not intended to replace advice given to you by your health care provider. Make sure you discuss any questions you have with your health care provider. Document Revised: 08/20/2021 Document Reviewed: 08/20/2021 Elsevier Patient Education  2023 Elsevier Inc.  

## 2022-03-03 NOTE — Progress Notes (Signed)
?[facility] ? ? ?Vincent Mcfarland is a 41 y.o. male presents for hypertension evaluation, Denies shortness of breath, headaches, chest pain or lower extremity edema, sudden onset, vision changes, unilateral weakness, dizziness, paresthesias  ? ?Patient reports adherence with medications. ? ?Dietary habits include: monitoring snacks reading labels for sodium intake ?Exercise habits include:walking active ?Family / Social history: father aneurysm and htn  ? ? ?Past Medical History:  ?Diagnosis Date  ? Diabetes mellitus without complication (HCC)   ? Hypertension   ? ?Past Surgical History:  ?Procedure Laterality Date  ? EYE SURGERY    ? ?No Known Allergies ?Current Outpatient Medications on File Prior to Visit  ?Medication Sig Dispense Refill  ? insulin NPH Human (NOVOLIN N) 100 UNIT/ML injection Inject 0.75 mLs (75 Units total) into the skin every morning. And syringes 1/day 75 mL 3  ? atorvastatin (LIPITOR) 40 MG tablet Take by mouth. (Patient not taking: Reported on 03/03/2022)    ? Continuous Blood Gluc Sensor (FREESTYLE LIBRE 2 SENSOR) MISC 1 Device by Does not apply route every 14 (fourteen) days. 6 each 3  ? metoprolol tartrate (LOPRESSOR) 25 MG tablet Take 1 tablet (25 mg total) by mouth 2 (two) times daily. 60 tablet 11  ? ?No current facility-administered medications on file prior to visit.  ? ?Social History  ? ?Socioeconomic History  ? Marital status: Married  ?  Spouse name: Not on file  ? Number of children: Not on file  ? Years of education: Not on file  ? Highest education level: Not on file  ?Occupational History  ? Not on file  ?Tobacco Use  ? Smoking status: Never  ? Smokeless tobacco: Never  ?Vaping Use  ? Vaping Use: Never used  ?Substance and Sexual Activity  ? Alcohol use: No  ? Drug use: No  ? Sexual activity: Not on file  ?Other Topics Concern  ? Not on file  ?Social History Narrative  ? Not on file  ? ?Social Determinants of Health  ? ?Financial Resource Strain: Not on file   ?Food Insecurity: Not on file  ?Transportation Needs: Not on file  ?Physical Activity: Not on file  ?Stress: Not on file  ?Social Connections: Not on file  ?Intimate Partner Violence: Not on file  ? ?Family History  ?Problem Relation Age of Onset  ? Healthy Mother   ? Aneurysm Father   ?     brain  ? Hypertension Father   ? Diabetes Neg Hx   ? ? ? ?OBJECTIVE: ? ?Vitals:  ? 03/03/22 1559 03/03/22 1615  ?BP: (!) 179/104 (!) 182/104  ?Pulse: 100 (!) 102  ?Temp: 98.1 ?F (36.7 ?C)   ?TempSrc: Oral   ?SpO2: 94%   ?Weight: 230 lb 9.6 oz (104.6 kg)   ?Height: 6' (1.829 m)   ? ? ?Physical Exam ?Physical exam: ?General: Vital signs reviewed.  Patient is well-developed and well-nourished, obese male in no acute distress and cooperative with exam. ?Head: Normocephalic and atraumatic. ?Eyes: EOMI, conjunctivae normal, no scleral icterus. ?Neck: Supple, trachea midline, normal ROM, no JVD, masses, thyromegaly, or carotid bruit present. ?Cardiovascular: RRR, S1 normal, S2 normal, no murmurs, gallops, or rubs. ?Pulmonary/Chest: Clear to auscultation bilaterally, no wheezes, rales, or rhonchi. ?Abdominal: Soft, non-tender, non-distended, BS +, no masses, organomegaly, or guarding present. ?Musculoskeletal: No joint deformities, erythema, or stiffness, ROM full and nontender. ?Extremities: No lower extremity edema bilaterally,  pulses symmetric and intact bilaterally. No cyanosis or clubbing. ?Neurological: A&O x3, Strength is normal ?Skin:  Warm, dry and intact. No rashes or erythema. ?Psychiatric: Normal mood and affect. speech and behavior is normal. Cognition and memory are normal. ?   ? ?ROS ?Comprehensive ROS Pertinent positive and negative noted in HPI   ?Last 3 Office BP readings: ?BP Readings from Last 3 Encounters:  ?03/03/22 (!) 182/104  ?12/27/21 (!) 180/110  ?04/30/21 (!) 164/90  ? ? ?BMET ?   ?Component Value Date/Time  ? NA 132 (L) 04/30/2021 0904  ? NA 138 11/14/2020 0903  ? K 4.7 04/30/2021 0904  ? CL 96 04/30/2021  0904  ? CO2 28 04/30/2021 0904  ? GLUCOSE 425 (H) 04/30/2021 0904  ? BUN 27 (H) 04/30/2021 0904  ? BUN 24 11/14/2020 0903  ? CREATININE 1.94 (H) 04/30/2021 0904  ? CALCIUM 9.1 04/30/2021 0904  ? GFRNONAA 32 (L) 12/02/2020 0328  ? GFRAA 72 11/14/2020 0903  ? ? ?Renal function: ?CrCl cannot be calculated (Patient's most recent lab result is older than the maximum 21 days allowed.). ? ?Clinical ASCVD: Yes  ?The 10-year ASCVD risk score (Arnett DK, et al., 2019) is: 21.4% ?  Values used to calculate the score: ?    Age: 51 years ?    Sex: Male ?    Is Non-Hispanic African American: Yes ?    Diabetic: Yes ?    Tobacco smoker: No ?    Systolic Blood Pressure: 182 mmHg ?    Is BP treated: Yes ?    HDL Cholesterol: 38.4 mg/dL ?    Total Cholesterol: 196 mg/dL ? ?ASCVD risk factors include- Vincent Mcfarland ? ? ?ASSESSMENT & PLAN: ?Vincent CollegeKelvin was seen today for blood pressure Mcfarland. ? ?Diagnoses and all orders for this visit: ? ?Essential hypertension ?BP goal - < 130/80 ?Explained that having normal blood pressure is the goal and medications are helping to get to goal and maintain normal blood pressure. ?DIET: Limit salt intake, read nutrition labels to Mcfarland salt content, limit fried and high fatty foods  ?Avoid using multisymptom OTC cold preparations that generally contain sudafed which can rise BP. Consult with pharmacist on best cold relief products to use for persons with HTN ?EXERCISE ?Discussed incorporating exercise such as walking - 30 minutes most days of the week and can do in 10 minute intervals .Minimize salt intake. ?Minimize alcohol intake ? ?-     amLODipine (NORVASC) 10 MG tablet; Take 1 tablet (10 mg total) by mouth daily. ?-     lisinopril-hydrochlorothiazide (ZESTORETIC) 20-25 MG tablet; Take 1 tablet by mouth daily. ? ?Mixed hyperlipidemia ? Healthy lifestyle diet of fruits vegetables fish nuts whole grains and low saturated fat . Foods high in cholesterol or liver, fatty meats,cheese, butter avocados, nuts and seeds,  chocolate and fried foods. ?-     Lipid panel; Future ? ?Medication refill ?-     amLODipine (NORVASC) 10 MG tablet; Take 1 tablet (10 mg total) by mouth daily. ?-     lisinopril-hydrochlorothiazide (ZESTORETIC) 20-25 MG tablet; Take 1 tablet by mouth daily. ? ?Meds ordered this encounter  ?Medications  ? amLODipine (NORVASC) 10 MG tablet  ?  Sig: Take 1 tablet (10 mg total) by mouth daily.  ?  Dispense:  90 tablet  ?  Refill:  1  ? lisinopril-hydrochlorothiazide (ZESTORETIC) 20-25 MG tablet  ?  Sig: Take 1 tablet by mouth daily.  ?  Dispense:  90 tablet  ?  Refill:  1  ? ? ?This note has been created with Teaching laboratory technicianDragon speech recognition software and smart  Lobbyist. Any transcriptional errors are unintentional.  ? ?Grayce Sessions, NP ?03/03/2022, 4:30 PM ?  ?

## 2022-03-27 DIAGNOSIS — E109 Type 1 diabetes mellitus without complications: Secondary | ICD-10-CM | POA: Diagnosis not present

## 2022-04-01 ENCOUNTER — Encounter (INDEPENDENT_AMBULATORY_CARE_PROVIDER_SITE_OTHER): Payer: Self-pay | Admitting: Primary Care

## 2022-04-01 ENCOUNTER — Ambulatory Visit (INDEPENDENT_AMBULATORY_CARE_PROVIDER_SITE_OTHER): Payer: BC Managed Care – PPO | Admitting: Primary Care

## 2022-04-01 VITALS — BP 164/96 | HR 95 | Temp 98.4°F | Ht 72.0 in | Wt 232.6 lb

## 2022-04-01 DIAGNOSIS — I10 Essential (primary) hypertension: Secondary | ICD-10-CM

## 2022-04-01 DIAGNOSIS — S91302A Unspecified open wound, left foot, initial encounter: Secondary | ICD-10-CM

## 2022-04-01 NOTE — Progress Notes (Unsigned)
[facility]   Meet Vincent Mcfarland is a 41 y.o. male presents for hypertension evaluation, Denies shortness of breath, headaches, chest pain or lower extremity edema, sudden onset, vision changes, unilateral weakness, dizziness, paresthesias   Patient reports adherence with medications.  Dietary habits include: monitoring sodium intake and healthy snack hoices  Exercise habits include:walking , push ups Family / Social history: MI 19 father died   Past Medical History:  Diagnosis Date   Diabetes mellitus without complication (HCC)    Hypertension    Past Surgical History:  Procedure Laterality Date   EYE SURGERY     No Known Allergies Current Outpatient Medications on File Prior to Visit  Medication Sig Dispense Refill   amLODipine (NORVASC) 10 MG tablet Take 1 tablet (10 mg total) by mouth daily. 90 tablet 1   insulin NPH Human (NOVOLIN N) 100 UNIT/ML injection Inject 0.75 mLs (75 Units total) into the skin every morning. And syringes 1/day 75 mL 3   lisinopril-hydrochlorothiazide (ZESTORETIC) 20-25 MG tablet Take 1 tablet by mouth daily. 90 tablet 1   atorvastatin (LIPITOR) 40 MG tablet Take by mouth. (Patient not taking: Reported on 03/03/2022)     metoprolol tartrate (LOPRESSOR) 25 MG tablet Take 1 tablet (25 mg total) by mouth 2 (two) times daily. 60 tablet 11   No current facility-administered medications on file prior to visit.   Social History   Socioeconomic History   Marital status: Married    Spouse name: Not on file   Number of children: Not on file   Years of education: Not on file   Highest education level: Not on file  Occupational History   Not on file  Tobacco Use   Smoking status: Never   Smokeless tobacco: Never  Vaping Use   Vaping Use: Never used  Substance and Sexual Activity   Alcohol use: No   Drug use: No   Sexual activity: Not on file  Other Topics Concern   Not on file  Social History Narrative   Not on file   Social  Determinants of Health   Financial Resource Strain: Not on file  Food Insecurity: Not on file  Transportation Needs: Not on file  Physical Activity: Not on file  Stress: Not on file  Social Connections: Not on file  Intimate Partner Violence: Not on file   Family History  Problem Relation Age of Onset   Healthy Mother    Aneurysm Father        brain   Hypertension Father    Diabetes Neg Hx      OBJECTIVE: BP (!) 164/96   Pulse 95   Temp 98.4 F (36.9 C) (Oral)   Ht 6' (1.829 m)   Wt 232 lb 9.6 oz (105.5 kg)   SpO2 98%   BMI 31.55 kg/m   Physical Exam Vitals reviewed.  Constitutional:      Appearance: He is obese.  HENT:     Head: Normocephalic.     Right Ear: External ear normal.     Left Ear: External ear normal.     Nose: Nose normal.  Eyes:     Extraocular Movements: Extraocular movements intact.  Cardiovascular:     Rate and Rhythm: Normal rate and regular rhythm.  Pulmonary:     Effort: Pulmonary effort is normal.     Breath sounds: Normal breath sounds.  Abdominal:     General: Bowel sounds are normal. There is distension.     Palpations: Abdomen is soft.  Musculoskeletal:        General: Normal range of motion.     Cervical back: Normal range of motion and neck supple.  Skin:    General: Skin is warm and dry.  Neurological:     Mental Status: He is alert and oriented to person, place, and time.  Psychiatric:        Mood and Affect: Mood normal.        Behavior: Behavior normal.        Thought Content: Thought content normal.        Judgment: Judgment normal.    ROS Comprehensive ROS Pertinent positive and negative noted in HPI   Last 3 Office BP readings: BP Readings from Last 3 Encounters:  03/03/22 (!) 182/104  12/27/21 (!) 180/110  04/30/21 (!) 164/90    BMET    Component Value Date/Time   NA 132 (L) 04/30/2021 0904   NA 138 11/14/2020 0903   K 4.7 04/30/2021 0904   CL 96 04/30/2021 0904   CO2 28 04/30/2021 0904   GLUCOSE 425  (H) 04/30/2021 0904   BUN 27 (H) 04/30/2021 0904   BUN 24 11/14/2020 0903   CREATININE 1.94 (H) 04/30/2021 0904   CALCIUM 9.1 04/30/2021 0904   GFRNONAA 32 (L) 12/02/2020 0328   GFRAA 72 11/14/2020 0903    Renal function: CrCl cannot be calculated (Patient's most recent lab result is older than the maximum 21 days allowed.).  Clinical ASCVD: Yes  The 10-year ASCVD risk score (Arnett DK, et al., 2019) is: 21.4%   Values used to calculate the score:     Age: 29 years     Sex: Male     Is Non-Hispanic African American: Yes     Diabetic: Yes     Tobacco smoker: No     Systolic Blood Pressure: 182 mmHg     Is BP treated: Yes     HDL Cholesterol: 38.4 mg/dL     Total Cholesterol: 196 mg/dL  ASCVD risk factors include- [place]   ASSESSMENT & PLAN:  Vincent Mcfarland was seen today for hypertension.  Diagnoses and all orders for this visit:  Essential hypertension He checks his blood pressure at home systolic ranges from 1 35-1 45 and diastolic ranges from 72-90(question if whitecoat syndrome) -Counseled on lifestyle modifications for blood pressure control including reduced dietary sodium, increased exercise, weight reduction and adequate sleep. Also, educated patient about the risk for cardiovascular events, stroke and heart attack. Also counseled patient about the importance of medication adherence. If you participate in smoking, it is important to stop using tobacco as this will increase the risks associated with uncontrolled blood pressure.  Goal BP:  For patients younger than 60: Goal BP < 130/80. For patients 60 and older: Goal BP < 140/90. For patients with diabetes: Goal BP < 130/80. Your most recent BP: 164/96 ( reck manually 150/82)  Minimize salt intake. Minimize alcohol intake -     Ambulatory referral to Cardiology  Wound of left foot   -     Anaerobic and Aerobic Culture     This note has been created with Dragon speech recognition software and Paediatric nurse.  Any transcriptional errors are unintentional.   Grayce Sessions, NP 04/01/2022, 10:04 AM

## 2022-04-05 LAB — ANAEROBIC AND AEROBIC CULTURE

## 2022-04-10 ENCOUNTER — Telehealth (INDEPENDENT_AMBULATORY_CARE_PROVIDER_SITE_OTHER): Payer: Self-pay

## 2022-04-10 ENCOUNTER — Ambulatory Visit: Payer: BC Managed Care – PPO | Admitting: Cardiology

## 2022-04-10 ENCOUNTER — Encounter: Payer: Self-pay | Admitting: Cardiology

## 2022-04-10 NOTE — Telephone Encounter (Signed)
Patient verified date of birth he is aware that culture did not grow any bacteria; no need for antibiotics. He verbalized understanding. Maryjean Morn, CMA

## 2022-04-10 NOTE — Telephone Encounter (Signed)
-----   Message from Grayce Sessions, NP sent at 04/09/2022  8:48 AM EDT ----- No bacteria grown from culture no abt's needed

## 2022-04-10 NOTE — Progress Notes (Deleted)
Primary Care Provider: Grayce Sessions, NP [facility] Cardiologist: None Electrophysiologist: None  Clinic Note: No chief complaint on file.   ===================================  ASSESSMENT/PLAN   Problem List Items Addressed This Visit       Cardiology Problems   Essential hypertension - Primary   ===================================  HPI:    Vincent Mcfarland is a 41 y.o. male who is being seen today for the Evaluation & Treatment of HYPERTENSION at the request of Grayce Sessions, NP. He has notable family history of CAD: 69 father died of MI   Osha Errico was last seen on ***  Recent Hospitalizations: ***  Reviewed  CV studies:    The following studies were reviewed today: (if available, images/films reviewed: From Epic Chart or Care Everywhere) ***:   Interval History:   Cherlyn Labella   CV Review of Symptoms (Summary) Cardiovascular ROS: {roscv:310661}  REVIEWED OF SYSTEMS   ROS  I have reviewed and (if needed) personally updated the patient's problem list, medications, allergies, past medical and surgical history, social and family history.   PAST MEDICAL HISTORY   Past Medical History:  Diagnosis Date   Diabetes mellitus without complication (HCC)    Hypertension     PAST SURGICAL HISTORY   Past Surgical History:  Procedure Laterality Date   EYE SURGERY      Immunization History  Administered Date(s) Administered   Tdap 11/15/2014    MEDICATIONS/ALLERGIES   No outpatient medications have been marked as taking for the 04/10/22 encounter (Appointment) with Marykay Lex, MD.    No Known Allergies  SOCIAL HISTORY/FAMILY HISTORY   Reviewed in Epic:   Social History   Tobacco Use   Smoking status: Never   Smokeless tobacco: Never  Vaping Use   Vaping Use: Never used  Substance Use Topics   Alcohol use: No   Drug use: No   Social History   Social History Narrative   Not on file   Family History  Problem  Relation Age of Onset   Healthy Mother    Aneurysm Father        brain   Hypertension Father    Diabetes Neg Hx     OBJCTIVE -PE, EKG, labs   Wt Readings from Last 3 Encounters:  04/01/22 232 lb 9.6 oz (105.5 kg)  03/03/22 230 lb 9.6 oz (104.6 kg)  12/27/21 229 lb 3.2 oz (104 kg)    Physical Exam: There were no vitals taken for this visit. Physical Exam   Adult ECG Report  Rate: *** ;  Rhythm: {rhythm:17366};   Narrative Interpretation: ***  Recent Labs:  ***  Lab Results  Component Value Date   CHOL 196 04/30/2021   HDL 38.40 (L) 04/30/2021   LDLDIRECT 100.0 04/30/2021   TRIG 208.0 (H) 04/30/2021   CHOLHDL 5 04/30/2021   Lab Results  Component Value Date   CREATININE 1.94 (H) 04/30/2021   BUN 27 (H) 04/30/2021   NA 132 (L) 04/30/2021   K 4.7 04/30/2021   CL 96 04/30/2021   CO2 28 04/30/2021      Latest Ref Rng & Units 12/02/2020    3:28 AM 12/01/2020    7:04 AM 11/30/2020   12:08 PM  CBC  WBC 4.0 - 10.5 K/uL 8.7  7.3  7.7   Hemoglobin 13.0 - 17.0 g/dL 07.3  71.0  62.6   Hematocrit 39.0 - 52.0 % 35.4  34.8  41.6   Platelets 150 - 400 K/uL 255  226  213     Lab Results  Component Value Date   HGBA1C 10.3 (A) 12/27/2021   Lab Results  Component Value Date   TSH 1.69 04/30/2021    ==================================================  COVID-19 Education: The signs and symptoms of COVID-19 were discussed with the patient and how to seek care for testing (follow up with PCP or arrange E-visit).    I spent a total of *** minutes with the patient spent in direct patient consultation.  Additional time spent with chart review  / charting (studies, outside notes, etc): *** min Total Time: *** min  Current medicines are reviewed at length with the patient today.  (+/- concerns) ***  This visit occurred during the SARS-CoV-2 public health emergency.  Safety protocols were in place, including screening questions prior to the visit, additional usage of staff PPE,  and extensive cleaning of exam room while observing appropriate contact time as indicated for disinfecting solutions.  Notice: This dictation was prepared with Dragon dictation along with smart phrase technology. Any transcriptional errors that result from this process are unintentional and may not be corrected upon review.   Studies Ordered:  No orders of the defined types were placed in this encounter.  No orders of the defined types were placed in this encounter.   Patient Instructions / Medication Changes & Studies & Tests Ordered   There are no Patient Instructions on file for this visit.    Bryan Lemma, M.D., M.S. Interventional Cardiologist   Pager # [phone] Phone # [phone] [address] [place], [place] [address]   Thank you for choosing [facility] in [place]!!

## 2022-04-11 ENCOUNTER — Encounter: Payer: Self-pay | Admitting: Cardiology

## 2022-04-26 DIAGNOSIS — E109 Type 1 diabetes mellitus without complications: Secondary | ICD-10-CM | POA: Diagnosis not present

## 2022-05-27 DIAGNOSIS — E109 Type 1 diabetes mellitus without complications: Secondary | ICD-10-CM | POA: Diagnosis not present

## 2022-06-27 DIAGNOSIS — E109 Type 1 diabetes mellitus without complications: Secondary | ICD-10-CM | POA: Diagnosis not present

## 2022-07-10 ENCOUNTER — Ambulatory Visit: Payer: BC Managed Care – PPO | Admitting: Cardiology

## 2022-07-27 DIAGNOSIS — E109 Type 1 diabetes mellitus without complications: Secondary | ICD-10-CM | POA: Diagnosis not present

## 2022-08-04 ENCOUNTER — Ambulatory Visit: Payer: BC Managed Care – PPO | Attending: Cardiology | Admitting: Cardiology

## 2022-08-04 ENCOUNTER — Encounter: Payer: Self-pay | Admitting: Cardiology

## 2022-08-27 DIAGNOSIS — E109 Type 1 diabetes mellitus without complications: Secondary | ICD-10-CM | POA: Diagnosis not present

## 2022-09-03 ENCOUNTER — Other Ambulatory Visit (INDEPENDENT_AMBULATORY_CARE_PROVIDER_SITE_OTHER): Payer: Self-pay | Admitting: Primary Care

## 2022-09-03 DIAGNOSIS — I10 Essential (primary) hypertension: Secondary | ICD-10-CM

## 2022-09-03 DIAGNOSIS — Z760 Encounter for issue of repeat prescription: Secondary | ICD-10-CM

## 2022-09-03 NOTE — Telephone Encounter (Signed)
Requested Prescriptions  Pending Prescriptions Disp Refills   lisinopril-hydrochlorothiazide (ZESTORETIC) 20-25 MG tablet [Pharmacy Med Name: LISINOPRIL-HCTZ 20/25MG TABLETS] 90 tablet 1    Sig: TAKE 1 TABLET BY MOUTH DAILY     Cardiovascular:  ACEI + Diuretic Combos Failed - 09/03/2022  4:25 PM      Failed - Na in normal range and within 180 days    Sodium  Date Value Ref Range Status  04/30/2021 132 (L) 135 - 145 mEq/L Final  11/14/2020 138 134 - 144 mmol/L Final         Failed - K in normal range and within 180 days    Potassium  Date Value Ref Range Status  04/30/2021 4.7 3.5 - 5.1 mEq/L Final         Failed - Cr in normal range and within 180 days    Creatinine, Ser  Date Value Ref Range Status  04/30/2021 1.94 (H) 0.40 - 1.50 mg/dL Final         Failed - eGFR is 30 or above and within 180 days    GFR calc Af Amer  Date Value Ref Range Status  11/14/2020 72 >59 mL/min/1.73 Final    Comment:    **In accordance with recommendations from the NKF-ASN Task force,**   [facility] is in the process of updating its eGFR calculation to the   2021 CKD-EPI creatinine equation that estimates kidney function   without a race variable.    GFR, Estimated  Date Value Ref Range Status  12/02/2020 32 (L) >60 mL/min Final    Comment:    (NOTE) Calculated using the CKD-EPI Creatinine Equation (2021)    GFR  Date Value Ref Range Status  04/30/2021 42.19 (L) >60.00 mL/min Final    Comment:    Calculated using the CKD-EPI Creatinine Equation (2021)         Failed - Last BP in normal range    BP Readings from Last 1 Encounters:  04/01/22 (!) 164/96         Passed - Patient is not pregnant      Passed - Valid encounter within last 6 months    Recent Outpatient Visits           5 months ago Essential hypertension   [place], Michelle P, NP   6 months ago Essential hypertension   [place], Michelle P, NP   1  year ago Hospital discharge follow-up   [place] Kerin Perna, NP   1 year ago Encounter to establish care   [place] Juluis Mire P, NP               amLODipine (NORVASC) 10 MG tablet [Pharmacy Med Name: AMLODIPINE BESYLATE 10MG TABLETS] 90 tablet 0    Sig: TAKE 1 TABLET(10 MG) BY MOUTH DAILY     Cardiovascular: Calcium Channel Blockers 2 Failed - 09/03/2022  4:25 PM      Failed - Last BP in normal range    BP Readings from Last 1 Encounters:  04/01/22 (!) 164/96         Passed - Last Heart Rate in normal range    Pulse Readings from Last 1 Encounters:  04/01/22 95         Passed - Valid encounter within last 6 months    Recent Outpatient Visits           5 months  ago Essential hypertension   [place], Michelle P, NP   6 months ago Essential hypertension   [place], [place], NP   1 year ago Hospital discharge follow-up   [place] Kerin Perna, NP   1 year ago Encounter to establish care   [place] Kerin Perna, NP

## 2022-09-03 NOTE — Telephone Encounter (Signed)
Requested medication (s) are due for refill today: yes  Requested medication (s) are on the active medication list: yes  Last refill:  03/03/22 #90 with 1 RF  Future visit scheduled: no, seen 04/01/22  Notes to clinic:  Failed protocol of labs within 12 months, no upcoming appt, please assess.       Requested Prescriptions  Pending Prescriptions Disp Refills   lisinopril-hydrochlorothiazide (ZESTORETIC) 20-25 MG tablet [Pharmacy Med Name: LISINOPRIL-HCTZ 20/25MG TABLETS] 90 tablet 1    Sig: TAKE 1 TABLET BY MOUTH DAILY     Cardiovascular:  ACEI + Diuretic Combos Failed - 09/03/2022  4:25 PM      Failed - Na in normal range and within 180 days    Sodium  Date Value Ref Range Status  04/30/2021 132 (L) 135 - 145 mEq/L Final  11/14/2020 138 134 - 144 mmol/L Final         Failed - K in normal range and within 180 days    Potassium  Date Value Ref Range Status  04/30/2021 4.7 3.5 - 5.1 mEq/L Final         Failed - Cr in normal range and within 180 days    Creatinine, Ser  Date Value Ref Range Status  04/30/2021 1.94 (H) 0.40 - 1.50 mg/dL Final         Failed - eGFR is 30 or above and within 180 days    GFR calc Af Amer  Date Value Ref Range Status  11/14/2020 72 >59 mL/min/1.73 Final    Comment:    **In accordance with recommendations from the NKF-ASN Task force,**   [facility] is in the process of updating its eGFR calculation to the   2021 CKD-EPI creatinine equation that estimates kidney function   without a race variable.    GFR, Estimated  Date Value Ref Range Status  12/02/2020 32 (L) >60 mL/min Final    Comment:    (NOTE) Calculated using the CKD-EPI Creatinine Equation (2021)    GFR  Date Value Ref Range Status  04/30/2021 42.19 (L) >60.00 mL/min Final    Comment:    Calculated using the CKD-EPI Creatinine Equation (2021)         Failed - Last BP in normal range    BP Readings from Last 1 Encounters:  04/01/22 (!) 164/96         Passed - Patient is  not pregnant      Passed - Valid encounter within last 6 months    Recent Outpatient Visits           5 months ago Essential hypertension   [place], Michelle P, NP   6 months ago Essential hypertension   [place], Michelle P, NP   1 year ago Hospital discharge follow-up   [place] Kerin Perna, NP   1 year ago Encounter to establish care   [place] Kerin Perna, NP              Signed Prescriptions Disp Refills   amLODipine (NORVASC) 10 MG tablet 90 tablet 0    Sig: TAKE 1 TABLET(10 MG) BY MOUTH DAILY     Cardiovascular: Calcium Channel Blockers 2 Failed - 09/03/2022  4:25 PM      Failed - Last BP in normal range    BP Readings from Last 1 Encounters:  04/01/22 (!) 164/96  Passed - Last Heart Rate in normal range    Pulse Readings from Last 1 Encounters:  04/01/22 95         Passed - Valid encounter within last 6 months    Recent Outpatient Visits           5 months ago Essential hypertension   [place], Michelle P, NP   6 months ago Essential hypertension   [place], [place], NP   1 year ago Hospital discharge follow-up   [place] Kerin Perna, NP   1 year ago Encounter to establish care   [place] Kerin Perna, NP

## 2022-09-04 NOTE — Telephone Encounter (Signed)
Will forward to provider  

## 2022-09-26 DIAGNOSIS — E109 Type 1 diabetes mellitus without complications: Secondary | ICD-10-CM | POA: Diagnosis not present

## 2023-04-02 ENCOUNTER — Other Ambulatory Visit (INDEPENDENT_AMBULATORY_CARE_PROVIDER_SITE_OTHER): Payer: BC Managed Care – PPO

## 2023-04-02 ENCOUNTER — Other Ambulatory Visit: Payer: Self-pay

## 2023-04-02 DIAGNOSIS — E119 Type 2 diabetes mellitus without complications: Secondary | ICD-10-CM

## 2023-04-02 LAB — LIPID PANEL
Cholesterol: 157 mg/dL (ref 0–200)
HDL: 39.4 mg/dL (ref >39.00)
LDL Cholesterol: 86 mg/dL (ref 0–99)
NonHDL: 117.41
Total CHOL/HDL Ratio: 4
Triglycerides: 156 mg/dL — ABNORMAL HIGH (ref 0.0–149.0)
VLDL: 31.2 mg/dL (ref 0.0–40.0)

## 2023-04-02 LAB — COMPREHENSIVE METABOLIC PANEL
ALT: 25 U/L (ref 0–53)
AST: 28 U/L (ref 0–37)
Albumin: 3.8 g/dL (ref 3.5–5.2)
Alkaline Phosphatase: 84 U/L (ref 39–117)
BUN: 31 mg/dL — ABNORMAL HIGH (ref 6–23)
CO2: 29 mEq/L (ref 19–32)
Calcium: 9.2 mg/dL (ref 8.4–10.5)
Chloride: 101 mEq/L (ref 96–112)
Creatinine, Ser: 2.13 mg/dL — ABNORMAL HIGH (ref 0.40–1.50)
GFR: 37.21 mL/min — ABNORMAL LOW (ref >60.00)
Glucose, Bld: 181 mg/dL — ABNORMAL HIGH (ref 70–99)
Potassium: 4.4 mEq/L (ref 3.5–5.1)
Sodium: 137 mEq/L (ref 135–145)
Total Bilirubin: 0.4 mg/dL (ref 0.2–1.2)
Total Protein: 7.3 g/dL (ref 6.0–8.3)

## 2023-04-02 LAB — MICROALBUMIN / CREATININE URINE RATIO
Creatinine,U: 118.8 mg/dL
Microalb Creat Ratio: 99.7 mg/g — ABNORMAL HIGH (ref 0.0–30.0)
Microalb, Ur: 118.4 mg/dL — ABNORMAL HIGH (ref 0.0–1.9)

## 2023-04-02 LAB — HEMOGLOBIN A1C: Hgb A1c MFr Bld: 13.2 % — ABNORMAL HIGH (ref 4.6–6.5)

## 2023-04-06 ENCOUNTER — Ambulatory Visit: Payer: BLUE CROSS/BLUE SHIELD | Admitting: "Endocrinology

## 2023-05-27 ENCOUNTER — Ambulatory Visit: Payer: BLUE CROSS/BLUE SHIELD | Admitting: "Endocrinology

## 2023-07-05 ENCOUNTER — Encounter (HOSPITAL_BASED_OUTPATIENT_CLINIC_OR_DEPARTMENT_OTHER): Payer: Self-pay

## 2023-07-05 ENCOUNTER — Other Ambulatory Visit: Payer: Self-pay

## 2023-07-05 ENCOUNTER — Emergency Department (HOSPITAL_BASED_OUTPATIENT_CLINIC_OR_DEPARTMENT_OTHER)
Admission: EM | Admit: 2023-07-05 | Discharge: 2023-07-06 | Disposition: A | Discharge status: Home / Self Care | Payer: BC Managed Care – PPO | Attending: Emergency Medicine | Admitting: Emergency Medicine

## 2023-07-05 DIAGNOSIS — R739 Hyperglycemia, unspecified: Secondary | ICD-10-CM

## 2023-07-05 DIAGNOSIS — J069 Acute upper respiratory infection, unspecified: Secondary | ICD-10-CM | POA: Insufficient documentation

## 2023-07-05 DIAGNOSIS — Z1152 Encounter for screening for COVID-19: Secondary | ICD-10-CM | POA: Insufficient documentation

## 2023-07-05 DIAGNOSIS — Z794 Long term (current) use of insulin: Secondary | ICD-10-CM | POA: Insufficient documentation

## 2023-07-05 DIAGNOSIS — R6883 Chills (without fever): Secondary | ICD-10-CM | POA: Diagnosis not present

## 2023-07-05 DIAGNOSIS — E1165 Type 2 diabetes mellitus with hyperglycemia: Secondary | ICD-10-CM | POA: Insufficient documentation

## 2023-07-05 LAB — CBC WITH DIFFERENTIAL/PLATELET
Abs Immature Granulocytes: 0.03 10*3/uL (ref 0.00–0.07)
Basophils Absolute: 0 10*3/uL (ref 0.0–0.1)
Basophils Relative: 0 %
Eosinophils Absolute: 0 10*3/uL (ref 0.0–0.5)
Eosinophils Relative: 0 %
HCT: 38.8 % — ABNORMAL LOW (ref 39.0–52.0)
Hemoglobin: 13.4 g/dL (ref 13.0–17.0)
Immature Granulocytes: 0 %
Lymphocytes Relative: 8 %
Lymphs Abs: 1 10*3/uL (ref 0.7–4.0)
MCH: 30.9 pg (ref 26.0–34.0)
MCHC: 34.5 g/dL (ref 30.0–36.0)
MCV: 89.4 fL (ref 80.0–100.0)
Monocytes Absolute: 0.7 10*3/uL (ref 0.1–1.0)
Monocytes Relative: 5 %
Neutro Abs: 11.2 10*3/uL — ABNORMAL HIGH (ref 1.7–7.7)
Neutrophils Relative %: 87 %
Platelets: 294 10*3/uL (ref 150–400)
RBC: 4.34 MIL/uL (ref 4.22–5.81)
RDW: 12.2 % (ref 11.5–15.5)
WBC: 12.9 10*3/uL — ABNORMAL HIGH (ref 4.0–10.5)
nRBC: 0 % (ref 0.0–0.2)

## 2023-07-05 LAB — RESP PANEL BY RT-PCR (RSV, FLU A&B, COVID)  RVPGX2
Influenza A by PCR: NEGATIVE
Influenza B by PCR: NEGATIVE
Resp Syncytial Virus by PCR: NEGATIVE
SARS Coronavirus 2 by RT PCR: NEGATIVE

## 2023-07-05 LAB — GROUP A STREP BY PCR: Group A Strep by PCR: NOT DETECTED

## 2023-07-05 LAB — CBG MONITORING, ED: Glucose-Capillary: 556 mg/dL (ref 70–99)

## 2023-07-05 MED ORDER — IBUPROFEN 800 MG PO TABS
800.0000 mg | ORAL_TABLET | Freq: Once | ORAL | Status: AC
  Administered 2023-07-05: 800 mg via ORAL
  Filled 2023-07-05: qty 1

## 2023-07-05 MED ORDER — SODIUM CHLORIDE 0.9 % IV BOLUS
1000.0000 mL | Freq: Once | INTRAVENOUS | Status: AC
  Administered 2023-07-05: 1000 mL via INTRAVENOUS

## 2023-07-05 NOTE — ED Triage Notes (Signed)
Pt states that he started having sore throat, chills, and fatigue x 1 day. Pt also states that he feels like his blood sugar is high but has no way to check it. He has missed one dose of his insulin.

## 2023-07-05 NOTE — ED Provider Notes (Signed)
[place] EMERGENCY DEPARTMENT AT [facility] Provider Note   CSN: [id number] Arrival date & time: 07/05/23  2224     History {Add pertinent medical, surgical, social history, OB history to HPI:1} Chief Complaint  Patient presents with   Fever   Hyperglycemia    Vincent Mcfarland is a 42 y.o. male.  HPI     This is a 42 year old male who presents with chills, sore throat, generalized fatigue.  He is unsure whether his blood sugar may be elevated.  Patient states that he has had symptoms for about 24 hours.  No known sick contacts.  States that he missed 1 dose of his insulin earlier today.  He generally has not felt well.  No cough, chest pain, shortness of breath, abdominal pain.  Temperature here 101.3.  Home Medications Prior to Admission medications   Medication Sig Start Date End Date Taking? Authorizing Provider  amLODipine (NORVASC) 10 MG tablet TAKE 1 TABLET(10 MG) BY MOUTH DAILY 09/03/22   Grayce Sessions, NP  atorvastatin (LIPITOR) 40 MG tablet Take by mouth. Patient not taking: Reported on 03/03/2022 11/01/20   [provider]  insulin NPH Human (NOVOLIN N) 100 UNIT/ML injection Inject 0.75 mLs (75 Units total) into the skin every morning. And syringes 1/day 12/27/21   Romero Belling, MD  lisinopril-hydrochlorothiazide (ZESTORETIC) 20-25 MG tablet TAKE 1 TABLET BY MOUTH DAILY 09/08/22   Grayce Sessions, NP  metoprolol tartrate (LOPRESSOR) 25 MG tablet Take 1 tablet (25 mg total) by mouth 2 (two) times daily. 12/02/20 12/02/21  Meredeth Ide, MD      Allergies    Patient has no known allergies.    Review of Systems   Review of Systems  Constitutional:  Positive for chills and fever.  HENT:  Positive for congestion and sore throat.   Respiratory:  Negative for cough and shortness of breath.   Cardiovascular:  Negative for chest pain.  Gastrointestinal:  Negative for abdominal pain.  All other systems reviewed and are negative.   Physical  Exam Updated Vital Signs BP (!) 157/100 (BP Location: Right Arm)   Pulse (!) 111   Temp (!) 101.3 F (38.5 C) (Oral)   Resp (!) 26   Ht 1.829 m (6')   Wt 97.5 kg   SpO2 96%   BMI 29.16 kg/m  Physical Exam Vitals and nursing note reviewed.  Constitutional:      Appearance: He is well-developed. He is obese. He is not ill-appearing.  HENT:     Head: Normocephalic and atraumatic.     Mouth/Throat:     Comments: Posterior oropharynx erythematous, uvula midline, no significant tonsillar swelling or exudate Eyes:     Pupils: Pupils are equal, round, and reactive to light.  Cardiovascular:     Rate and Rhythm: Regular rhythm. Tachycardia present.     Heart sounds: Normal heart sounds. No murmur heard. Pulmonary:     Effort: Pulmonary effort is normal. No respiratory distress.     Breath sounds: Normal breath sounds. No wheezing.  Abdominal:     General: Bowel sounds are normal.     Palpations: Abdomen is soft.     Tenderness: There is no abdominal tenderness. There is no rebound.  Musculoskeletal:     Cervical back: Neck supple.  Lymphadenopathy:     Cervical: No cervical adenopathy.  Skin:    General: Skin is warm and dry.  Neurological:     Mental Status: He is alert and oriented to person, place,  and time.  Psychiatric:        Mood and Affect: Mood normal.     ED Results / Procedures / Treatments   Labs (all labs ordered are listed, but only abnormal results are displayed) Labs Reviewed  CBC WITH DIFFERENTIAL/PLATELET - Abnormal; Notable for the following components:      Result Value   WBC 12.9 (*)    HCT 38.8 (*)    Neutro Abs 11.2 (*)    All other components within normal limits  CBG MONITORING, ED - Abnormal; Notable for the following components:   Glucose-Capillary 556 (*)    All other components within normal limits  RESP PANEL BY RT-PCR (RSV, FLU A&B, COVID)  RVPGX2  GROUP A STREP BY PCR  BASIC METABOLIC PANEL    EKG None  Radiology No results  found.  Procedures Procedures  {Document cardiac monitor, telemetry assessment procedure when appropriate:1}  Medications Ordered in ED Medications  sodium chloride 0.9 % bolus 1,000 mL (1,000 mLs Intravenous New Bag/Given 07/05/23 2327)  ibuprofen (ADVIL) tablet 800 mg (800 mg Oral Given 07/05/23 2313)    ED Course/ Medical Decision Making/ A&P   {   Click here for ABCD2, HEART and other calculatorsREFRESH Note before signing :1}                              Medical Decision Making Amount and/or Complexity of Data Reviewed Labs: ordered.  Risk Prescription drug management.   ***  {Document critical care time when appropriate:1} {Document review of labs and clinical decision tools ie heart score, Chads2Vasc2 etc:1}  {Document your independent review of radiology images, and any outside records:1} {Document your discussion with family members, caretakers, and with consultants:1} {Document social determinants of health affecting pt's care:1} {Document your decision making why or why not admission, treatments were needed:1} Final Clinical Impression(s) / ED Diagnoses Final diagnoses:  None    Rx / DC Orders ED Discharge Orders     None

## 2023-07-06 LAB — BASIC METABOLIC PANEL
Anion gap: 15 (ref 5–15)
BUN: 35 mg/dL — ABNORMAL HIGH (ref 6–20)
CO2: 21 mmol/L — ABNORMAL LOW (ref 22–32)
Calcium: 9.3 mg/dL (ref 8.9–10.3)
Chloride: 94 mmol/L — ABNORMAL LOW (ref 98–111)
Creatinine, Ser: 2.74 mg/dL — ABNORMAL HIGH (ref 0.61–1.24)
GFR, Estimated: 29 mL/min — ABNORMAL LOW (ref >60)
Glucose, Bld: 585 mg/dL (ref 70–99)
Potassium: 4.6 mmol/L (ref 3.5–5.1)
Sodium: 130 mmol/L — ABNORMAL LOW (ref 135–145)

## 2023-07-06 LAB — CBG MONITORING, ED
Glucose-Capillary: 420 mg/dL — ABNORMAL HIGH (ref 70–99)
Glucose-Capillary: 459 mg/dL — ABNORMAL HIGH (ref 70–99)

## 2023-07-06 MED ORDER — SODIUM CHLORIDE 0.9 % IV BOLUS
1000.0000 mL | Freq: Once | INTRAVENOUS | Status: AC
  Administered 2023-07-06: 1000 mL via INTRAVENOUS

## 2023-07-06 NOTE — ED Notes (Signed)
Dr. Wilkie Aye aware that blood sugar is 593

## 2023-07-06 NOTE — Discharge Instructions (Signed)
You were seen today for fever and upper respiratory symptoms.  Your COVID and flu testing are negative.  Regarding your hyperglycemia, you need to make sure that you are taking your insulin as instructed.  Follow-up with your endocrinologist for adjustments in your prescription insulin.  Make sure that you are staying hydrated.

## 2023-07-22 ENCOUNTER — Other Ambulatory Visit (INDEPENDENT_AMBULATORY_CARE_PROVIDER_SITE_OTHER): Payer: Self-pay | Admitting: Primary Care

## 2023-07-22 DIAGNOSIS — I10 Essential (primary) hypertension: Secondary | ICD-10-CM

## 2023-07-22 DIAGNOSIS — Z760 Encounter for issue of repeat prescription: Secondary | ICD-10-CM

## 2023-08-03 ENCOUNTER — Other Ambulatory Visit: Payer: Self-pay

## 2023-08-03 DIAGNOSIS — E119 Type 2 diabetes mellitus without complications: Secondary | ICD-10-CM

## 2023-08-03 MED ORDER — INSULIN SYRINGES (DISPOSABLE) U-100 1 ML MISC: 0.5000 mL | Freq: Every day | 2 refills | Status: DC

## 2023-09-10 DIAGNOSIS — E1065 Type 1 diabetes mellitus with hyperglycemia: Secondary | ICD-10-CM | POA: Diagnosis not present

## 2023-10-15 ENCOUNTER — Other Ambulatory Visit (INDEPENDENT_AMBULATORY_CARE_PROVIDER_SITE_OTHER): Payer: Self-pay | Admitting: Primary Care

## 2023-10-15 DIAGNOSIS — Z760 Encounter for issue of repeat prescription: Secondary | ICD-10-CM

## 2023-10-15 DIAGNOSIS — I10 Essential (primary) hypertension: Secondary | ICD-10-CM

## 2023-11-13 ENCOUNTER — Ambulatory Visit: Payer: BLUE CROSS/BLUE SHIELD | Admitting: "Endocrinology

## 2024-01-14 ENCOUNTER — Other Ambulatory Visit (INDEPENDENT_AMBULATORY_CARE_PROVIDER_SITE_OTHER): Payer: Self-pay | Admitting: Primary Care

## 2024-01-14 DIAGNOSIS — Z760 Encounter for issue of repeat prescription: Secondary | ICD-10-CM

## 2024-01-14 DIAGNOSIS — I10 Essential (primary) hypertension: Secondary | ICD-10-CM

## 2024-01-25 ENCOUNTER — Other Ambulatory Visit (INDEPENDENT_AMBULATORY_CARE_PROVIDER_SITE_OTHER): Payer: Self-pay | Admitting: Primary Care

## 2024-01-25 DIAGNOSIS — Z760 Encounter for issue of repeat prescription: Secondary | ICD-10-CM

## 2024-01-25 DIAGNOSIS — I10 Essential (primary) hypertension: Secondary | ICD-10-CM

## 2024-01-25 NOTE — Telephone Encounter (Unsigned)
 Copied from CRM [id number]. Topic: Clinical - Medication Refill >> Jan 25, 2024  5:01 PM [name] wrote: Most Recent Primary Care Visit:  Provider: Grayce Sessions  Department: [facility]  Visit Type: OFFICE VISIT  Date: 04/01/2022  Medication: amLODipine (NORVASC) 10 MG tablet [  Has the patient contacted their pharmacy? Yes (Agent: If no, request that the patient contact the pharmacy for the refill. If patient does not wish to contact the pharmacy document the reason why and proceed with request.) (Agent: If yes, when and what did the pharmacy advise?)  Is this the correct pharmacy for this prescription? Yes  [id number] [place], [place] - [address] AT [facility] [address] [place] [address] Phone: [phone] Fax: [phone]   Has the prescription been filled recently? Yes  Is the patient out of the medication? Yes  Has the patient been seen for an appointment in the last year OR does the patient have an upcoming appointment? Yes  Can we respond through MyChart? No  Agent: Please be advised that Rx refills may take up to 3 business days. We ask that you follow-up with your pharmacy.

## 2024-01-27 NOTE — Telephone Encounter (Signed)
 Requested medication (s) are due for refill today: Yes  Requested medication (s) are on the active medication list: Yes  Last refill:    Future visit scheduled: Yes  Notes to clinic:  Prescription has expired.    Requested Prescriptions  Pending Prescriptions Disp Refills   lisinopril-hydrochlorothiazide (ZESTORETIC) 20-25 MG tablet [Pharmacy Med Name: LISINOPRIL-HCTZ 20/25MG  TABLETS] 90 tablet 0    Sig: TAKE 1 TABLET BY MOUTH DAILY     Cardiovascular:  ACEI + Diuretic Combos Failed - 01/27/2024  2:57 PM      Failed - Na in normal range and within 180 days    Sodium  Date Value Ref Range Status  07/05/2023 130 (L) 135 - 145 mmol/L Final  11/14/2020 138 134 - 144 mmol/L Final         Failed - K in normal range and within 180 days    Potassium  Date Value Ref Range Status  07/05/2023 4.6 3.5 - 5.1 mmol/L Final         Failed - Cr in normal range and within 180 days    Creatinine, Ser  Date Value Ref Range Status  07/05/2023 2.74 (H) 0.61 - 1.24 mg/dL Final   Creatinine,U  Date Value Ref Range Status  04/02/2023 118.8 mg/dL Final         Failed - eGFR is 30 or above and within 180 days    GFR calc Af Amer  Date Value Ref Range Status  11/14/2020 72 >59 mL/min/1.73 Final    Comment:    **In accordance with recommendations from the NKF-ASN Task force,**   [facility] is in the process of updating its eGFR calculation to the   2021 CKD-EPI creatinine equation that estimates kidney function   without a race variable.    GFR, Estimated  Date Value Ref Range Status  07/05/2023 29 (L) >60 mL/min Final    Comment:    (NOTE) Calculated using the CKD-EPI Creatinine Equation (2021)    GFR  Date Value Ref Range Status  04/02/2023 37.21 (L) >60.00 mL/min Final    Comment:    Calculated using the CKD-EPI Creatinine Equation (2021)         Failed - Last BP in normal range    BP Readings from Last 1 Encounters:  07/06/23 (!) 142/93         Failed - Valid encounter  within last 6 months    Recent Outpatient Visits           1 year ago Essential hypertension   [place] [facility] Grayce Sessions, NP   1 year ago Essential hypertension   [place] [facility] Grayce Sessions, NP   3 years ago Hospital discharge follow-up   [place] [facility] Grayce Sessions, NP   3 years ago Encounter to establish care   [place] [facility] Grayce Sessions, NP              Passed - Patient is not pregnant

## 2024-01-27 NOTE — Telephone Encounter (Signed)
 Requested medication (s) are due for refill today: yes  Requested medication (s) are on the active medication list: yes  Last refill:  amlodipine: 09/03/22/ #90 lisinopril- hydrochlorothiazide: 09/08/22  Future visit scheduled: no  Notes to clinic:  has apt on 02/05/24   Requested Prescriptions  Pending Prescriptions Disp Refills   lisinopril-hydrochlorothiazide (ZESTORETIC) 20-25 MG tablet [Pharmacy Med Name: LISINOPRIL-HCTZ 20/25MG  TABLETS] 90 tablet 0    Sig: TAKE 1 TABLET BY MOUTH DAILY     Cardiovascular:  ACEI + Diuretic Combos Failed - 01/27/2024  2:55 PM      Failed - Na in normal range and within 180 days    Sodium  Date Value Ref Range Status  07/05/2023 130 (L) 135 - 145 mmol/L Final  11/14/2020 138 134 - 144 mmol/L Final         Failed - K in normal range and within 180 days    Potassium  Date Value Ref Range Status  07/05/2023 4.6 3.5 - 5.1 mmol/L Final         Failed - Cr in normal range and within 180 days    Creatinine, Ser  Date Value Ref Range Status  07/05/2023 2.74 (H) 0.61 - 1.24 mg/dL Final   Creatinine,U  Date Value Ref Range Status  04/02/2023 118.8 mg/dL Final         Failed - eGFR is 30 or above and within 180 days    GFR calc Af Amer  Date Value Ref Range Status  11/14/2020 72 >59 mL/min/1.73 Final    Comment:    **In accordance with recommendations from the NKF-ASN Task force,**   [facility] is in the process of updating its eGFR calculation to the   2021 CKD-EPI creatinine equation that estimates kidney function   without a race variable.    GFR, Estimated  Date Value Ref Range Status  07/05/2023 29 (L) >60 mL/min Final    Comment:    (NOTE) Calculated using the CKD-EPI Creatinine Equation (2021)    GFR  Date Value Ref Range Status  04/02/2023 37.21 (L) >60.00 mL/min Final    Comment:    Calculated using the CKD-EPI Creatinine Equation (2021)         Failed - Last BP in normal range    BP Readings from Last 1 Encounters:   07/06/23 (!) 142/93         Failed - Valid encounter within last 6 months    Recent Outpatient Visits           1 year ago Essential hypertension   [place] [facility] Grayce Sessions, NP   1 year ago Essential hypertension   [place] [facility] Grayce Sessions, NP   3 years ago Hospital discharge follow-up   [place] [facility] Grayce Sessions, NP   3 years ago Encounter to establish care   [place] [facility] Grayce Sessions, NP              Passed - Patient is not pregnant       amLODipine (NORVASC) 10 MG tablet 90 tablet 0     Cardiovascular: Calcium Channel Blockers 2 Failed - 01/27/2024  2:55 PM      Failed - Last BP in normal range    BP Readings from Last 1 Encounters:  07/06/23 (!) 142/93         Failed - Valid encounter within last 6 months    Recent  Outpatient Visits           1 year ago Essential hypertension   [place] [facility] Grayce Sessions, NP   1 year ago Essential hypertension   [place] [facility] Grayce Sessions, NP   3 years ago Hospital discharge follow-up   [place] [facility] Grayce Sessions, NP   3 years ago Encounter to establish care    [facility] Grayce Sessions, NP              Passed - Last Heart Rate in normal range    Pulse Readings from Last 1 Encounters:  07/06/23 96

## 2024-02-08 ENCOUNTER — Ambulatory Visit (INDEPENDENT_AMBULATORY_CARE_PROVIDER_SITE_OTHER): Admitting: Primary Care

## 2024-02-08 ENCOUNTER — Telehealth (INDEPENDENT_AMBULATORY_CARE_PROVIDER_SITE_OTHER): Payer: Self-pay | Admitting: Primary Care

## 2024-02-08 NOTE — Telephone Encounter (Signed)
 Called pt to reschedule missed appt. Pt did not answer and could not LVM. Please call back or reschedule appt for pt.

## 2024-02-12 ENCOUNTER — Ambulatory Visit: Payer: Self-pay

## 2024-02-12 ENCOUNTER — Other Ambulatory Visit (INDEPENDENT_AMBULATORY_CARE_PROVIDER_SITE_OTHER): Payer: Self-pay | Admitting: Primary Care

## 2024-02-12 DIAGNOSIS — I10 Essential (primary) hypertension: Secondary | ICD-10-CM

## 2024-02-12 DIAGNOSIS — Z760 Encounter for issue of repeat prescription: Secondary | ICD-10-CM

## 2024-02-12 NOTE — Telephone Encounter (Signed)
 Office has declined Rx refill request- as needs appointment Requested Prescriptions  Pending Prescriptions Disp Refills   amLODipine  (NORVASC ) 10 MG tablet 90 tablet 0     Cardiovascular: Calcium  Channel Blockers 2 Failed - 02/12/2024  3:36 PM      Failed - Last BP in normal range    BP Readings from Last 1 Encounters:  07/06/23 (!) 142/93         Failed - Valid encounter within last 6 months    Recent Outpatient Visits           1 year ago Essential hypertension   [place] [facility] Marius Siemens, NP   1 year ago Essential hypertension   [place] [facility] Marius Siemens, NP   3 years ago Hospital discharge follow-up   [place] [facility] Marius Siemens, NP   3 years ago Encounter to establish care   [place] [facility] Marius Siemens, NP              Passed - Last Heart Rate in normal range    Pulse Readings from Last 1 Encounters:  07/06/23 96          lisinopril -hydrochlorothiazide  (ZESTORETIC ) 20-25 MG tablet 90 tablet 0    Sig: Take 1 tablet by mouth daily.     Cardiovascular:  ACEI + Diuretic Combos Failed - 02/12/2024  3:36 PM      Failed - Na in normal range and within 180 days    Sodium  Date Value Ref Range Status  07/05/2023 130 (L) 135 - 145 mmol/L Final  11/14/2020 138 134 - 144 mmol/L Final         Failed - K in normal range and within 180 days    Potassium  Date Value Ref Range Status  07/05/2023 4.6 3.5 - 5.1 mmol/L Final         Failed - Cr in normal range and within 180 days    Creatinine, Ser  Date Value Ref Range Status  07/05/2023 2.74 (H) 0.61 - 1.24 mg/dL Final   Creatinine,U  Date Value Ref Range Status  04/02/2023 118.8 mg/dL Final         Failed - eGFR is 30 or above and within 180 days    GFR calc Af Amer  Date Value Ref Range Status  11/14/2020 72 >59 mL/min/1.73 Final    Comment:    **In accordance with recommendations  from the NKF-ASN Task force,**   [facility] is in the process of updating its eGFR calculation to the   2021 CKD-EPI creatinine equation that estimates kidney function   without a race variable.    GFR, Estimated  Date Value Ref Range Status  07/05/2023 29 (L) >60 mL/min Final    Comment:    (NOTE) Calculated using the CKD-EPI Creatinine Equation (2021)    GFR  Date Value Ref Range Status  04/02/2023 37.21 (L) >60.00 mL/min Final    Comment:    Calculated using the CKD-EPI Creatinine Equation (2021)         Failed - Last BP in normal range    BP Readings from Last 1 Encounters:  07/06/23 (!) 142/93         Failed - Valid encounter within last 6 months    Recent Outpatient Visits           1 year ago Essential hypertension   [place] [facility] Madelyn Schick  P, NP   1 year ago Essential hypertension   [place] [facility] Marius Siemens, NP   3 years ago Hospital discharge follow-up   [place] [facility] Marius Siemens, NP   3 years ago Encounter to establish care   [place] [facility] Marius Siemens, NP              Passed - Patient is not pregnant

## 2024-02-12 NOTE — Telephone Encounter (Signed)
 Copied from CRM [id number]. Topic: Clinical - Medication Refill >> Feb 12, 2024 10:44 AM Star East wrote: Most Recent Primary Care Visit:  Provider: Marius Siemens  Department: [facility]  Visit Type: OFFICE VISIT  Date: 04/01/2022  Medication: amLODipine  (NORVASC ) 10 MG tablet lisinopril -hydrochlorothiazide  (ZESTORETIC ) 20-25 MG tablet   Has the patient contacted their pharmacy? No (Agent: If no, request that the patient contact the pharmacy for the refill. If patient does not wish to contact the pharmacy document the reason why and proceed with request.) (Agent: If yes, when and what did the pharmacy advise?)  Is this the correct pharmacy for this prescription? Yes If no, delete pharmacy and type the correct one.  This is the patient's preferred pharmacy:    [id number] - [place], [place] - [address] AT [facility] [address] [facility] [address] [place] [address] Phone: [phone] Fax: [phone]   Has the prescription been filled recently? Yes  Is the patient out of the medication? Yes  Has the patient been seen for an appointment in the last year OR does the patient have an upcoming appointment? Yes  Can we respond through MyChart? No  Agent: Please be advised that Rx refills may take up to 3 business days. We ask that you follow-up with your pharmacy.

## 2024-02-12 NOTE — Telephone Encounter (Signed)
 Copied from CRM [id number]. Topic: Clinical - Medication Question >> Feb 12, 2024  2:04 PM Tiffany S wrote: Reason for CRM: Patient is frustrated and is requesting call back from nurse triage regarding medication for his blood pressure  Would like BP refilled; has been out for 3 weeks. Please call patient back.

## 2024-02-13 ENCOUNTER — Encounter (INDEPENDENT_AMBULATORY_CARE_PROVIDER_SITE_OTHER): Payer: Self-pay | Admitting: Primary Care

## 2024-02-17 ENCOUNTER — Ambulatory Visit (INDEPENDENT_AMBULATORY_CARE_PROVIDER_SITE_OTHER): Admitting: Primary Care

## 2024-02-18 ENCOUNTER — Ambulatory Visit: Admitting: Physician Assistant

## 2024-02-18 ENCOUNTER — Encounter: Payer: Self-pay | Admitting: Physician Assistant

## 2024-02-18 VITALS — BP 196/109 | HR 110 | Ht 78.0 in

## 2024-02-18 DIAGNOSIS — I10 Essential (primary) hypertension: Secondary | ICD-10-CM | POA: Diagnosis not present

## 2024-02-18 DIAGNOSIS — E559 Vitamin D deficiency, unspecified: Secondary | ICD-10-CM

## 2024-02-18 DIAGNOSIS — E1022 Type 1 diabetes mellitus with diabetic chronic kidney disease: Secondary | ICD-10-CM | POA: Diagnosis not present

## 2024-02-18 DIAGNOSIS — R748 Abnormal levels of other serum enzymes: Secondary | ICD-10-CM

## 2024-02-18 DIAGNOSIS — N183 Chronic kidney disease, stage 3 unspecified: Secondary | ICD-10-CM

## 2024-02-18 DIAGNOSIS — N1832 Chronic kidney disease, stage 3b: Secondary | ICD-10-CM

## 2024-02-18 DIAGNOSIS — Z794 Long term (current) use of insulin: Secondary | ICD-10-CM | POA: Diagnosis not present

## 2024-02-18 DIAGNOSIS — E1029 Type 1 diabetes mellitus with other diabetic kidney complication: Secondary | ICD-10-CM

## 2024-02-18 DIAGNOSIS — R739 Hyperglycemia, unspecified: Secondary | ICD-10-CM

## 2024-02-18 LAB — POCT URINALYSIS DIP (CLINITEK)
Bilirubin, UA: NEGATIVE
Glucose, UA: 500 mg/dL — AB
Ketones, POC UA: NEGATIVE mg/dL
Leukocytes, UA: NEGATIVE
Nitrite, UA: NEGATIVE
POC PROTEIN,UA: >=300 — AB
Spec Grav, UA: 1.02 (ref 1.010–1.025)
Urobilinogen, UA: 0.2 U/dL
pH, UA: 6 (ref 5.0–8.0)

## 2024-02-18 LAB — POCT GLYCOSYLATED HEMOGLOBIN (HGB A1C): Hemoglobin A1C: 12.3 % — AB (ref 4.0–5.6)

## 2024-02-18 LAB — GLUCOSE, POCT (MANUAL RESULT ENTRY): POC Glucose: 544 mg/dL — AB (ref 70–99)

## 2024-02-18 MED ORDER — INSULIN NPH (HUMAN) (ISOPHANE) 100 UNIT/ML ~~LOC~~ SUSP: 75.0000 [IU] | SUBCUTANEOUS | 3 refills | Status: AC

## 2024-02-18 MED ORDER — AMLODIPINE BESYLATE 10 MG PO TABS
10.0000 mg | ORAL_TABLET | Freq: Every day | ORAL | 0 refills | Status: DC
Start: 2024-02-18 — End: 2024-04-07

## 2024-02-18 MED ORDER — LISINOPRIL-HYDROCHLOROTHIAZIDE 20-25 MG PO TABS
1.0000 | ORAL_TABLET | Freq: Every day | ORAL | 0 refills | Status: DC
Start: 2024-02-18 — End: 2024-04-07

## 2024-02-18 MED ORDER — FREESTYLE LIBRE 3 SENSOR MISC
1.0000 | Freq: Every day | 0 refills | Status: AC
Start: 2024-02-18 — End: ?

## 2024-02-18 MED ORDER — INSULIN SYRINGES (DISPOSABLE) U-100 1 ML MISC: 0.5000 mL | Freq: Every day | 2 refills | Status: AC

## 2024-02-18 MED ORDER — INSULIN REGULAR HUMAN 100 UNIT/ML IJ SOLN
25.0000 [IU] | Freq: Three times a day (TID) | INTRAMUSCULAR | 11 refills | Status: AC
Start: 2024-02-18 — End: ?

## 2024-02-18 MED ORDER — INSULIN ASPART 100 UNIT/ML IJ SOLN
10.0000 [IU] | Freq: Once | INTRAMUSCULAR | Status: AC
  Administered 2024-02-18: 10 [IU] via SUBCUTANEOUS

## 2024-02-18 MED ORDER — INSULIN NPH (HUMAN) (ISOPHANE) 100 UNIT/ML ~~LOC~~ SUSP
10.0000 [IU] | Freq: Once | SUBCUTANEOUS | Status: DC
Start: 2024-02-18 — End: 2024-02-18

## 2024-02-18 NOTE — Progress Notes (Signed)
 Established Patient Office Visit  Subjective   Patient ID: Vincent Mcfarland, male    DOB: 12/10/1980  Age: 43 y.o. MRN: [id number]  Chief Complaint  Patient presents with   Medication Refill   Diabetes   Discussed the use of AI scribe software for clinical note transcription with the patient, who gave verbal consent to proceed.  History of Present Illness   The patient, a 43 year old with a history of type 1 diabetes, hypertension, and kidney disease, presents with concerns about high blood sugar levels and high blood pressure. The patient has been out of his blood pressure medication for about a month and reports feeling sluggish after meals. The patient is currently taking Novolin insulin  and is interested in restarting N insulin . The patient's A1c is 12.3, indicating poor blood sugar control. The patient is also interested in dietary advice to manage his diabetes.     Past Medical History:  Diagnosis Date   Diabetes mellitus without complication (HCC)    Hypertension    Social History   Socioeconomic History   Marital status: Married    Spouse name: Not on file   Number of children: Not on file   Years of education: Not on file   Highest education level: Not on file  Occupational History   Not on file  Tobacco Use   Smoking status: Never   Smokeless tobacco: Never  Vaping Use   Vaping status: Never Used  Substance and Sexual Activity   Alcohol use: No   Drug use: No   Sexual activity: Not on file  Other Topics Concern   Not on file  Social History Narrative   Dietary habits include: monitoring sodium intake and healthy snack hoices    Exercise habits include:walking , push ups   Social Drivers of Health   Financial Resource Strain: Not on file  Food Insecurity: Not on file  Transportation Needs: Not on file  Physical Activity: Not on file  Stress: Not on file  Social Connections: Not on file  Intimate Partner Violence: Not on file   Family History   Problem Relation Age of Onset   Healthy Mother    Aneurysm Father        brain   Hypertension Father    Diabetes Neg Hx    No Known Allergies  Review of Systems  Constitutional: Negative.   HENT: Negative.    Eyes:  Negative for blurred vision.  Respiratory:  Negative for shortness of breath.   Cardiovascular:  Negative for chest pain.  Gastrointestinal: Negative.   Genitourinary: Negative.   Musculoskeletal: Negative.   Skin: Negative.   Neurological:  Negative for dizziness and weakness.  Endo/Heme/Allergies: Negative.   Psychiatric/Behavioral: Negative.        Objective:     BP (!) 196/109 (BP Location: Left Arm, Patient Position: Sitting, Cuff Size: Large)   Pulse (!) 110   Ht 6\' 6"  (1.981 m)   SpO2 99%   BMI 24.85 kg/m  BP Readings from Last 3 Encounters:  02/18/24 (!) 196/109  07/06/23 (!) 142/93  04/01/22 (!) 164/96   Wt Readings from Last 3 Encounters:  07/05/23 215 lb (97.5 kg)  04/01/22 232 lb 9.6 oz (105.5 kg)  03/03/22 230 lb 9.6 oz (104.6 kg)    Physical Exam Vitals and nursing note reviewed.    GENERAL: Alert, cooperative, well developed, no acute distress. HEENT: Normocephalic, normal oropharynx, moist mucous membranes. CHEST: Clear to auscultation bilaterally, no wheezes, rhonchi, or crackles. CARDIOVASCULAR: Regular  rhythm, tachycardia, S1 and S2 normal without murmurs. ABDOMEN: Soft, non-tender, non-distended, without organomegaly, normal bowel sounds. EXTREMITIES: No cyanosis or edema. NEUROLOGICAL: Cranial nerves grossly intact, moves all extremities without gross motor or sensory deficit.   Assessment & Plan:   Problem List Items Addressed This Visit       Cardiovascular and Mediastinum   Essential hypertension   Relevant Medications   amLODipine  (NORVASC ) 10 MG tablet   lisinopril -hydrochlorothiazide  (ZESTORETIC ) 20-25 MG tablet     Other   Hyperglycemia   Relevant Medications   insulin  NPH Human (NOVOLIN N) 100 UNIT/ML  injection   insulin  aspart (novoLOG ) injection 10 Units   Other Visit Diagnoses       Elevated random blood glucose level    -  Primary   Relevant Orders   POCT glucose (manual entry) (Completed)   POCT glycosylated hemoglobin (Hb A1C) (Completed)   POCT URINALYSIS DIP (CLINITEK) (Completed)     Type 1 diabetes mellitus with stage 3b chronic kidney disease (HCC)       Relevant Medications   insulin  NPH Human (NOVOLIN N) 100 UNIT/ML injection   insulin  regular (HUMULIN R ) 100 units/mL injection   Insulin  Syringes, Disposable, U-100 1 ML MISC   lisinopril -hydrochlorothiazide  (ZESTORETIC ) 20-25 MG tablet   Continuous Glucose Sensor (FREESTYLE LIBRE 3 SENSOR) MISC   insulin  aspart (novoLOG ) injection 10 Units   Other Relevant Orders   CBC with Differential/Platelet   Comp. Metabolic Panel (12)   TSH   Vitamin D, 25-hydroxy   Referral to Nutrition and Diabetes Services   Microalbumin / creatinine urine ratio     Medication refill       Relevant Medications   amLODipine  (NORVASC ) 10 MG tablet   lisinopril -hydrochlorothiazide  (ZESTORETIC ) 20-25 MG tablet     Results LABS A1c: 12.3% (02/18/2024) GFR: 37 mL/min/1.22m (09/20/2023) Ketones: Negative (02/18/2024)  Assessment and Plan Type 1 Diabetes Mellitus Elevated A1c of 12.3% indicates poor blood sugar control, likely due to dietary choices and inconsistent medication use. He has not been taking N insulin  recently.  - Prescribe Freestyle Libre for continuous glucose monitoring. - Refer to a nutritionist for dietary management, focusing on reducing intake of bread, pasta, crackers, and sugary drinks, and emphasizing whole foods. - Prescribe Novolog  insulin .   Chronic Kidney Disease, Stage 3 Stage 3 CKD with a GFR of 37 noted five months ago. Uncontrolled blood sugar and blood pressure impact kidney function. Regular monitoring and medication adherence are crucial. - Order labs to check kidney function. - Monitor blood pressure  and blood sugar levels closely.  Hypertension Poorly controlled hypertension due to being out of medication for about a month. Blood pressure significantly elevated. Clonidine not administered due to potential drowsiness during his long drive. - Prescribe lisinopril  hydrochlorothiazide  and amlodipine . - Monitor blood pressure at home using a wrist cuff or manual sphygmomanometer. Red flags given for prompt reevaluation    I have reviewed the patient's medical history (PMH, PSH, Social History, Family History, Medications, and allergies) , and have been updated if relevant. I spent 30 minutes reviewing chart and  face to face time with patient.    Return in about 13 days (around 03/02/2024) for with Madelyn Schick .    Etter Hermann Mayers, PA-C

## 2024-02-18 NOTE — Patient Instructions (Addendum)
 VISIT SUMMARY:  You came in today with concerns about high blood sugar levels and high blood pressure. We discussed your current medications, dietary habits, and the importance of regular monitoring and follow-ups. Your A1c level is 12.3%, indicating poor blood sugar control. We also reviewed your history of eye issues and kidney disease.  YOUR PLAN:  -TYPE 1 DIABETES MELLITUS: Type 1 diabetes is a condition where your body does not produce insulin , leading to high blood sugar levels. Your A1c level is 12.3%, which shows poor blood sugar control. We will start you on Novolog  insulin  and prescribe a Freestyle Libre for continuous glucose monitoring. You will also be referred to a nutritionist to help manage your diet, focusing on reducing bread, pasta, crackers, and sugary drinks, and emphasizing whole foods. Additionally, you will receive a prescription for a blood glucose monitoring device.  -DIABETIC RETINOPATHY: Diabetic retinopathy is an eye condition caused by diabetes that can lead to vision problems. You have had previous laser surgeries for this condition. It is important to have regular follow-ups with an eye specialist to monitor your eye health.  -CHRONIC KIDNEY DISEASE, STAGE 3: Chronic kidney disease (CKD) is a condition where your kidneys are damaged and cannot filter blood as well as they should. Your kidney function is currently at stage 3, with a GFR of 37 noted five months ago. Uncontrolled blood sugar and blood pressure can worsen kidney function. We will order labs to check your kidney function and closely monitor your blood pressure and blood sugar levels.  -HYPERTENSION: Hypertension, or high blood pressure, is a condition where the force of the blood against your artery walls is too high. Your blood pressure is currently poorly controlled because you have been out of medication for about a month. We will prescribe lisinopril  hydrochlorothiazide  and amlodipine  to help manage your  blood pressure. Please pick up your medications from [facility] on [address] and [address]. You should also monitor your blood pressure at home.  INSTRUCTIONS:  Please follow up with an eye specialist for your diabetic retinopathy. Pick up your blood pressure medications from [facility] on [address] and [address]. Monitor your blood pressure at home regularly. We will also order labs to check your kidney function. You will be referred to a nutritionist for dietary management. Use the [facility] for continuous glucose monitoring and follow the prescribed insulin  regimen.  Carbohydrate Counting for Diabetes Mellitus, Adult Carbohydrate counting is a method of keeping track of how many carbohydrates you eat. Eating carbohydrates increases the amount of sugar (glucose) in the blood. Counting how many carbohydrates you eat improves how well you manage your blood glucose. This, in turn, helps you manage your diabetes. Carbohydrates are measured in grams (g) per serving. It is important to know how many carbohydrates (in grams or by serving size) you can have in each meal. This is different for every person. A dietitian can help you make a meal plan and calculate how many carbohydrates you should have at each meal and snack. What foods contain carbohydrates? Carbohydrates are found in the following foods: Grains, such as breads and cereals. Dried beans and soy products. Starchy vegetables, such as potatoes, peas, and corn. Fruit and fruit juices. Milk and yogurt. Sweets and snack foods, such as cake, cookies, candy, chips, and soft drinks. How do I count carbohydrates in foods? There are two ways to count carbohydrates in food. You can read food labels or learn standard serving sizes of foods. You can  use either of these methods or a combination of both. Using the Nutrition Facts label The Nutrition Facts list is included on the labels of almost all packaged foods and beverages in  the [place] . It includes: The serving size. Information about nutrients in each serving, including the grams of carbohydrate per serving. To use the Nutrition Facts, decide how many servings you will have. Then, multiply the number of servings by the number of carbohydrates per serving. The resulting number is the total grams of carbohydrates that you will be having. Learning the standard serving sizes of foods When you eat carbohydrate foods that are not packaged or do not include Nutrition Facts on the label, you need to measure the servings in order to count the grams of carbohydrates. Measure the foods that you will eat with a food scale or measuring cup, if needed. Decide how many standard-size servings you will eat. Multiply the number of servings by 15. For foods that contain carbohydrates, one serving equals 15 g of carbohydrates. For example, if you eat 2 cups or 10 oz (300 g) of strawberries, you will have eaten 2 servings and 30 g of carbohydrates (2 servings x 15 g = 30 g). For foods that have more than one food mixed, such as soups and casseroles, you must count the carbohydrates in each food that is included. The following list contains standard serving sizes of common carbohydrate-rich foods. Each of these servings has about 15 g of carbohydrates: 1 slice of bread. 1 six-inch (15 cm) tortilla. ? cup or 2 oz (53 g) cooked rice or pasta.  cup or 3 oz (85 g) cooked or canned, drained and rinsed beans or lentils.  cup or 3 oz (85 g) starchy vegetable, such as peas, corn, or squash.  cup or 4 oz (120 g) hot cereal.  cup or 3 oz (85 g) boiled or mashed potatoes, or  or 3 oz (85 g) of a large baked potato.  cup or 4 fl oz (118 mL) fruit juice. 1 cup or 8 fl oz (237 mL) milk. 1 small or 4 oz (106 g) apple.  or 2 oz (63 g) of a medium banana. 1 cup or 5 oz (150 g) strawberries. 3 cups or 1 oz (28.3 g) popped popcorn. What is an example of carbohydrate counting? To  calculate the grams of carbohydrates in this sample meal, follow the steps shown below. Sample meal 3 oz (85 g) chicken breast. ? cup or 4 oz (106 g) brown rice.  cup or 3 oz (85 g) corn. 1 cup or 8 fl oz (237 mL) milk. 1 cup or 5 oz (150 g) strawberries with sugar-free whipped topping. Carbohydrate calculation Identify the foods that contain carbohydrates: Rice. Corn. Milk. Strawberries. Calculate how many servings you have of each food: 2 servings rice. 1 serving corn. 1 serving milk. 1 serving strawberries. Multiply each number of servings by 15 g: 2 servings rice x 15 g = 30 g. 1 serving corn x 15 g = 15 g. 1 serving milk x 15 g = 15 g. 1 serving strawberries x 15 g = 15 g. Add together all of the amounts to find the total grams of carbohydrates eaten: 30 g + 15 g + 15 g + 15 g = 75 g of carbohydrates total. What are tips for following this plan? Shopping Develop a meal plan and then make a shopping list. Buy fresh and frozen vegetables, fresh and frozen fruit, dairy, eggs, beans, lentils, and  whole grains. Look at food labels. Choose foods that have more fiber and less sugar. Avoid processed foods and foods with added sugars. Meal planning Aim to have the same number of grams of carbohydrates at each meal and for each snack time. Plan to have regular, balanced meals and snacks. Where to find more information American Diabetes Association: diabetes.org Centers for Disease Control and Prevention: Ton[employer].no Academy of Nutrition and Dietetics: eatright.org Association of Diabetes Care & Education Specialists: diabeteseducator.org Summary Carbohydrate counting is a method of keeping track of how many carbohydrates you eat. Eating carbohydrates increases the amount of sugar (glucose) in your blood. Counting how many carbohydrates you eat improves how well you manage your blood glucose. This helps you manage your diabetes. A dietitian can help you make a meal plan and  calculate how many carbohydrates you should have at each meal and snack. This information is not intended to replace advice given to you by your health care provider. Make sure you discuss any questions you have with your health care provider. Document Revised: 05/15/2020 Document Reviewed: 05/16/2020 Elsevier Patient Education  2024 [employer].

## 2024-02-19 LAB — CBC WITH DIFFERENTIAL/PLATELET
Basophils Absolute: 0 10*3/uL (ref 0.0–0.2)
Basos: 0 %
EOS (ABSOLUTE): 0 10*3/uL (ref 0.0–0.4)
Eos: 0 %
Hematocrit: 42.2 % (ref 37.5–51.0)
Hemoglobin: 13.6 g/dL (ref 13.0–17.7)
Immature Grans (Abs): 0 10*3/uL (ref 0.0–0.1)
Immature Granulocytes: 0 %
Lymphocytes Absolute: 1.7 10*3/uL (ref 0.7–3.1)
Lymphs: 18 %
MCH: 31.5 pg (ref 26.6–33.0)
MCHC: 32.2 g/dL (ref 31.5–35.7)
MCV: 98 fL — ABNORMAL HIGH (ref 79–97)
Monocytes Absolute: 0.4 10*3/uL (ref 0.1–0.9)
Monocytes: 4 %
Neutrophils Absolute: 7.6 10*3/uL — ABNORMAL HIGH (ref 1.4–7.0)
Neutrophils: 78 %
Platelets: 379 10*3/uL (ref 150–450)
RBC: 4.32 x10E6/uL (ref 4.14–5.80)
RDW: 11.6 % (ref 11.6–15.4)
WBC: 9.8 10*3/uL (ref 3.4–10.8)

## 2024-02-19 LAB — COMP. METABOLIC PANEL (12)
AST: 38 IU/L (ref 0–40)
Albumin: 3.8 g/dL — ABNORMAL LOW (ref 4.1–5.1)
Alkaline Phosphatase: 179 IU/L — ABNORMAL HIGH (ref 44–121)
BUN/Creatinine Ratio: 14 (ref 9–20)
BUN: 32 mg/dL — ABNORMAL HIGH (ref 6–24)
Bilirubin Total: <0.2 mg/dL (ref 0.0–1.2)
Calcium: 9.1 mg/dL (ref 8.7–10.2)
Chloride: 95 mmol/L — ABNORMAL LOW (ref 96–106)
Creatinine, Ser: 2.31 mg/dL — ABNORMAL HIGH (ref 0.76–1.27)
Globulin, Total: 2.9 g/dL (ref 1.5–4.5)
Glucose: 496 mg/dL — ABNORMAL HIGH (ref 70–99)
Potassium: 4.7 mmol/L (ref 3.5–5.2)
Sodium: 134 mmol/L (ref 134–144)
Total Protein: 6.7 g/dL (ref 6.0–8.5)
eGFR: 35 mL/min/{1.73_m2} — ABNORMAL LOW (ref >59)

## 2024-02-19 LAB — VITAMIN D 25 HYDROXY (VIT D DEFICIENCY, FRACTURES): Vit D, 25-Hydroxy: 15.9 ng/mL — ABNORMAL LOW (ref 30.0–100.0)

## 2024-02-19 LAB — MICROALBUMIN / CREATININE URINE RATIO
Creatinine, Urine: 30.1 mg/dL
Microalb/Creat Ratio: 1630 mg/g{creat} — ABNORMAL HIGH (ref 0–29)
Microalbumin, Urine: 490.5 ug/mL

## 2024-02-19 LAB — TSH: TSH: 0.954 u[IU]/mL (ref 0.450–4.500)

## 2024-02-22 DIAGNOSIS — E1029 Type 1 diabetes mellitus with other diabetic kidney complication: Secondary | ICD-10-CM | POA: Insufficient documentation

## 2024-02-22 DIAGNOSIS — E1022 Type 1 diabetes mellitus with diabetic chronic kidney disease: Secondary | ICD-10-CM | POA: Insufficient documentation

## 2024-02-22 DIAGNOSIS — R748 Abnormal levels of other serum enzymes: Secondary | ICD-10-CM | POA: Insufficient documentation

## 2024-02-22 DIAGNOSIS — E559 Vitamin D deficiency, unspecified: Secondary | ICD-10-CM | POA: Insufficient documentation

## 2024-02-22 MED ORDER — VITAMIN D (ERGOCALCIFEROL) 1.25 MG (50000 UNIT) PO CAPS: 50000.0000 [IU] | ORAL_CAPSULE | ORAL | 2 refills | Status: DC

## 2024-02-22 NOTE — Addendum Note (Signed)
 Addended by: Malcom Scriver on: 02/22/2024 10:34 AM   Modules accepted: Orders

## 2024-02-23 ENCOUNTER — Encounter: Payer: Self-pay | Admitting: "Endocrinology

## 2024-02-23 ENCOUNTER — Ambulatory Visit (INDEPENDENT_AMBULATORY_CARE_PROVIDER_SITE_OTHER): Admitting: "Endocrinology

## 2024-02-23 ENCOUNTER — Telehealth (INDEPENDENT_AMBULATORY_CARE_PROVIDER_SITE_OTHER): Payer: Self-pay | Admitting: Primary Care

## 2024-02-23 VITALS — BP 140/100 | HR 110 | Ht 78.0 in | Wt 204.0 lb

## 2024-02-23 DIAGNOSIS — E1065 Type 1 diabetes mellitus with hyperglycemia: Secondary | ICD-10-CM | POA: Diagnosis not present

## 2024-02-23 DIAGNOSIS — E782 Mixed hyperlipidemia: Secondary | ICD-10-CM

## 2024-02-23 DIAGNOSIS — N289 Disorder of kidney and ureter, unspecified: Secondary | ICD-10-CM | POA: Diagnosis not present

## 2024-02-23 MED ORDER — BLOOD GLUCOSE MONITORING SUPPL DEVI: 1.0000 | Freq: Three times a day (TID) | 0 refills | Status: AC

## 2024-02-23 MED ORDER — LANCET DEVICE MISC
1.0000 | Freq: Three times a day (TID) | 0 refills | Status: AC
Start: 2024-02-23 — End: 2024-03-24

## 2024-02-23 MED ORDER — FREESTYLE LIBRE 3 PLUS SENSOR MISC: 1 refills | Status: AC

## 2024-02-23 MED ORDER — BLOOD GLUCOSE TEST VI STRP: 1.0000 | ORAL_STRIP | Freq: Three times a day (TID) | 3 refills | Status: AC

## 2024-02-23 MED ORDER — LANCETS MISC. MISC
1.0000 | Freq: Three times a day (TID) | 3 refills | Status: AC
Start: 2024-02-23 — End: 2024-03-24

## 2024-02-23 MED ORDER — DEXCOM G7 SENSOR MISC: 1.0000 | 1 refills | Status: AC

## 2024-02-23 MED ORDER — BAQSIMI ONE PACK 3 MG/DOSE NA POWD: 1.0000 | NASAL | 3 refills | Status: AC | PRN

## 2024-02-23 NOTE — Patient Instructions (Signed)

## 2024-02-23 NOTE — Telephone Encounter (Signed)
 Called pt to confirm appt. Pt will be present.

## 2024-02-23 NOTE — Progress Notes (Signed)
 Outpatient Endocrinology Note Jorge Newcomer, MD  02/23/24   Vincent Mcfarland [birth year] [id number]  Referring Provider: Marius Siemens, NP Primary Care Provider: Marius Siemens, NP Reason for consultation: Subjective   Assessment & Plan  Diagnoses and all orders for this visit:  Uncontrolled type 1 diabetes mellitus with hyperglycemia (HCC) -     Ambulatory referral to diabetic education  Nephropathy  Mixed hypercholesterolemia and hypertriglyceridemia  Other orders -     Blood Glucose Monitoring Suppl DEVI; 1 each by Does not apply route in the morning, at noon, and at bedtime. May substitute to any manufacturer covered by patient's insurance. -     Glucose Blood (BLOOD GLUCOSE TEST STRIPS) STRP; 1 each by In Vitro route in the morning, at noon, and at bedtime. May substitute to any manufacturer covered by patient's insurance. -     Lancet Device MISC; 1 each by Does not apply route in the morning, at noon, and at bedtime. May substitute to any manufacturer covered by patient's insurance. -     Lancets Misc. MISC; 1 each by Does not apply route in the morning, at noon, and at bedtime. May substitute to any manufacturer covered by patient's insurance. -     Continuous Glucose Sensor (DEXCOM G7 SENSOR) MISC; 1 Device by Does not apply route continuous. -     Continuous Glucose Sensor (FREESTYLE LIBRE 3 PLUS SENSOR) MISC; Change sensor every 15 days. -     Glucagon (BAQSIMI ONE PACK) 3 MG/DOSE POWD; Place 1 Device into the nose as needed (Low blood sugar with impaired consciousness).   Diabetes Type I complicated by +  retinopathy, +  nephropathy Lab Results  Component Value Date   GFR 37.21 (L) 04/02/2023   Hba1c goal less than 7, current Hba1c is  Lab Results  Component Value Date   HGBA1C 12.3 (A) 02/18/2024   Will recommend the following: Humulin R  25 units after break fast and dinner, 15-20 units after lunch - instructed to take it 30 minutes before  meals No known contraindications/side effects to any of above medications Glucagon discussed and prescribed with refills on 02/23/24   Ordered both libre 3+ and Dexcom as well as blood glucose meter with supplies Need to check blood sugar 3 times daily AC plus at bedtime if not using CGM and bring the log/meter on every visit Discussed with patient the importance of checking blood sugars as I cannot adjust insulin  otherwise  -Last LD and Tg are as follows: Lab Results  Component Value Date   LDLCALC 86 04/02/2023    Lab Results  Component Value Date   TRIG 156.0 (H) 04/02/2023   -02/23/24: not on statin, discussed use at length, patient is not interested  -Follow low fat diet and exercise   -Blood pressure goal <140/90 - Microalbumin/creatinine goal is < 30 -Last MA/Cr is as follows: Lab Results  Component Value Date   MICROALBUR 118.4 (H) 04/02/2023   -on ACE/ARB lisinopril  20mg  daily  -diet changes including salt restriction -limit eating outside -counseled BP targets per standards of diabetes care -uncontrolled blood pressure can lead to retinopathy, nephropathy and cardiovascular and atherosclerotic heart disease  Reviewed and counseled on: -A1C target -Blood sugar targets -Complications of uncontrolled diabetes  -Checking blood sugar before meals and bedtime and bring log next visit -All medications with mechanism of action and side effects -Hypoglycemia management: rule of 15's, Glucagon Emergency Kit and medical alert ID -low-carb low-fat plate-method diet -At least 20 minutes  of physical activity per day -Annual dilated retinal eye exam and foot exam -compliance and follow up needs -follow up as scheduled or earlier if problem gets worse  Call if blood sugar is less than 70 or consistently above 250    Take a 15 gm snack of carbohydrate at bedtime before you go to sleep if your blood sugar is less than 100.    If you are going to fast after midnight for a test  or procedure, ask your physician for instructions on how to reduce/decrease your insulin  dose.    Call if blood sugar is less than 70 or consistently above 250  -Treating a low sugar by rule of 15  (15 gms of sugar every 15 min until sugar is more than 70) If you feel your sugar is low, test your sugar to be sure If your sugar is low (less than 70), then take 15 grams of a fast acting Carbohydrate (3-4 glucose tablets or glucose gel or 4 ounces of juice or regular soda) Recheck your sugar 15 min after treating low to make sure it is more than 70 If sugar is still less than 70, treat again with 15 grams of carbohydrate          Don't drive the hour of hypoglycemia  If unconscious/unable to eat or drink by mouth, use glucagon injection or nasal spray baqsimi and call 911. Can repeat again in 15 min if still unconscious.  Return in about 1 month (around 03/24/2024).   I have reviewed current medications, nurse's notes, allergies, vital signs, past medical and surgical history, family medical history, and social history for this encounter. Counseled patient on symptoms, examination findings, lab findings, imaging results, treatment decisions and monitoring and prognosis. The patient understood the recommendations and agrees with the treatment plan. All questions regarding treatment plan were fully answered.  Jorge Newcomer, MD  02/23/24    History of Present Illness Vincent Mcfarland is a 43 y.o. year old male who presents for evaluation of Type II diabetes mellitus.  Vincent Mcfarland was first diagnosed in 1989 at 43 yrs ago.   Diabetes education +  Home diabetes regimen: Humulin R  25 units after break fast and dinner, 15-20 units after lunch - an hour after meals   COMPLICATIONS -  MI/Stroke +  retinopathy -  neuropathy +  nephropathy  SYMPTOMS REVIEWED - Polyuria - Weight loss - Blurred vision  BLOOD SUGAR DATA Has not checked sugars ina  long time   Physical Exam  BP (!) 140/100    Pulse (!) 110   Ht 6\' 6"  (1.981 m)   Wt 204 lb (92.5 kg)   SpO2 97%   BMI 23.57 kg/m    Constitutional: well developed, well nourished Head: normocephalic, atraumatic Eyes: sclera anicteric, no redness Neck: supple Lungs: normal respiratory effort Neurology: alert and oriented Skin: dry, no appreciable rashes Musculoskeletal: no appreciable defects Psychiatric: normal mood and affect Diabetic Foot Exam - Simple   No data filed      Current Medications Patient's Medications  New Prescriptions   BLOOD GLUCOSE MONITORING SUPPL DEVI    1 each by Does not apply route in the morning, at noon, and at bedtime. May substitute to any manufacturer covered by patient's insurance.   CONTINUOUS GLUCOSE SENSOR (DEXCOM G7 SENSOR) MISC    1 Device by Does not apply route continuous.   CONTINUOUS GLUCOSE SENSOR (FREESTYLE LIBRE 3 PLUS SENSOR) MISC    Change sensor every 15 days.  GLUCAGON (BAQSIMI ONE PACK) 3 MG/DOSE POWD    Place 1 Device into the nose as needed (Low blood sugar with impaired consciousness).   GLUCOSE BLOOD (BLOOD GLUCOSE TEST STRIPS) STRP    1 each by In Vitro route in the morning, at noon, and at bedtime. May substitute to any manufacturer covered by patient's insurance.   LANCET DEVICE MISC    1 each by Does not apply route in the morning, at noon, and at bedtime. May substitute to any manufacturer covered by patient's insurance.   LANCETS MISC. MISC    1 each by Does not apply route in the morning, at noon, and at bedtime. May substitute to any manufacturer covered by patient's insurance.  Previous Medications   AMLODIPINE  (NORVASC ) 10 MG TABLET    Take 1 tablet (10 mg total) by mouth daily.   ATORVASTATIN (LIPITOR) 40 MG TABLET    Take by mouth.   CONTINUOUS GLUCOSE SENSOR (FREESTYLE LIBRE 3 SENSOR) MISC    1 each by Does not apply route daily. Place 1 sensor on the skin every 14 days. Use to check glucose continuously   INSULIN  NPH HUMAN (NOVOLIN N) 100 UNIT/ML INJECTION     Inject 0.75 mLs (75 Units total) into the skin every morning. And syringes 1/day   INSULIN  REGULAR (HUMULIN R ) 100 UNITS/ML INJECTION    Inject 0.25 mLs (25 Units total) into the skin 3 (three) times daily before meals.   INSULIN  SYRINGES, DISPOSABLE, U-100 1 ML MISC    0.5 mLs by Does not apply route daily. INJECT UP TO 50 UNITS DAILY/BD INS SYRNG UF 0.5 ML [id number]   LISINOPRIL -HYDROCHLOROTHIAZIDE  (ZESTORETIC ) 20-25 MG TABLET    Take 1 tablet by mouth daily.   METOPROLOL  TARTRATE (LOPRESSOR ) 25 MG TABLET    Take 1 tablet (25 mg total) by mouth 2 (two) times daily.   VITAMIN D, ERGOCALCIFEROL, (DRISDOL) 1.25 MG (50000 UNIT) CAPS CAPSULE    Take 1 capsule (50,000 Units total) by mouth every 7 (seven) days.  Modified Medications   No medications on file  Discontinued Medications   No medications on file    Allergies No Known Allergies  Past Medical History Past Medical History:  Diagnosis Date   Diabetes mellitus without complication (HCC)    Hypertension     Past Surgical History Past Surgical History:  Procedure Laterality Date   EYE SURGERY      Family History family history includes Aneurysm in his father; Healthy in his mother; Hypertension in his father.  Social History Social History   Socioeconomic History   Marital status: Married    Spouse name: Not on file   Number of children: Not on file   Years of education: Not on file   Highest education level: Not on file  Occupational History   Not on file  Tobacco Use   Smoking status: Never   Smokeless tobacco: Never  Vaping Use   Vaping status: Never Used  Substance and Sexual Activity   Alcohol use: No   Drug use: No   Sexual activity: Not on file  Other Topics Concern   Not on file  Social History Narrative   Dietary habits include: monitoring sodium intake and healthy snack hoices    Exercise habits include:walking , push ups   Social Drivers of Health   Financial Resource Strain: Not on file  Food  Insecurity: Not on file  Transportation Needs: Not on file  Physical Activity: Not on file  Stress: Not  on file  Social Connections: Not on file  Intimate Partner Violence: Not on file    Lab Results  Component Value Date   HGBA1C 12.3 (A) 02/18/2024   HGBA1C 13.2 (H) 04/02/2023   HGBA1C 10.3 (A) 12/27/2021   Lab Results  Component Value Date   CHOL 157 04/02/2023   Lab Results  Component Value Date   HDL 39.40 04/02/2023   Lab Results  Component Value Date   LDLCALC 86 04/02/2023   Lab Results  Component Value Date   TRIG 156.0 (H) 04/02/2023   Lab Results  Component Value Date   CHOLHDL 4 04/02/2023   Lab Results  Component Value Date   CREATININE 2.31 (H) 02/18/2024   Lab Results  Component Value Date   GFR 37.21 (L) 04/02/2023   Lab Results  Component Value Date   MICROALBUR 118.4 (H) 04/02/2023      Component Value Date/Time   NA 134 02/18/2024 1634   K 4.7 02/18/2024 1634   CL 95 (L) 02/18/2024 1634   CO2 21 (L) 07/05/2023 2314   GLUCOSE 496 (H) 02/18/2024 1634   GLUCOSE 585 (HH) 07/05/2023 2314   BUN 32 (H) 02/18/2024 1634   CREATININE 2.31 (H) 02/18/2024 1634   CALCIUM  9.1 02/18/2024 1634   PROT 6.7 02/18/2024 1634   ALBUMIN 3.8 (L) 02/18/2024 1634   AST 38 02/18/2024 1634   ALT 25 04/02/2023 1113   ALKPHOS 179 (H) 02/18/2024 1634   BILITOT <0.2 02/18/2024 1634   GFRNONAA 29 (L) 07/05/2023 2314   GFRAA 72 11/14/2020 0903      Latest Ref Rng & Units 02/18/2024    4:34 PM 07/05/2023   11:14 PM 04/02/2023   11:13 AM  BMP  Glucose 70 - 99 mg/dL 409  811  C 914   BUN 6 - 24 mg/dL 32  35  31   Creatinine 0.76 - 1.27 mg/dL 7.82  9.56  2.13   BUN/Creat Ratio 9 - 20 14     Sodium 134 - 144 mmol/L 134  130  137   Potassium 3.5 - 5.2 mmol/L 4.7  4.6  4.4   Chloride 96 - 106 mmol/L 95  94  101   CO2 22 - 32 mmol/L  21  29   Calcium  8.7 - 10.2 mg/dL 9.1  9.3  9.2     C Corrected result       Component Value Date/Time   WBC 9.8 02/18/2024  1634   WBC 12.9 (H) 07/05/2023 2314   RBC 4.32 02/18/2024 1634   RBC 4.34 07/05/2023 2314   HGB 13.6 02/18/2024 1634   HCT 42.2 02/18/2024 1634   PLT 379 02/18/2024 1634   MCV 98 (H) 02/18/2024 1634   MCH 31.5 02/18/2024 1634   MCH 30.9 07/05/2023 2314   MCHC 32.2 02/18/2024 1634   MCHC 34.5 07/05/2023 2314   RDW 11.6 02/18/2024 1634   LYMPHSABS 1.7 02/18/2024 1634   MONOABS 0.7 07/05/2023 2314   EOSABS 0.0 02/18/2024 1634   BASOSABS 0.0 02/18/2024 1634     Parts of this note may have been dictated using voice recognition software. There may be variances in spelling and vocabulary which are unintentional. Not all errors are proofread. Please notify the [facility] if any discrepancies are noted or if the meaning of any statement is not clear.

## 2024-03-01 ENCOUNTER — Telehealth (INDEPENDENT_AMBULATORY_CARE_PROVIDER_SITE_OTHER): Payer: Self-pay | Admitting: Primary Care

## 2024-03-01 NOTE — Telephone Encounter (Signed)
 Called to confirm appt. Pt did not answer and LVM

## 2024-03-02 ENCOUNTER — Ambulatory Visit (INDEPENDENT_AMBULATORY_CARE_PROVIDER_SITE_OTHER): Admitting: Primary Care

## 2024-03-02 ENCOUNTER — Telehealth (INDEPENDENT_AMBULATORY_CARE_PROVIDER_SITE_OTHER): Payer: Self-pay | Admitting: Primary Care

## 2024-03-02 NOTE — Telephone Encounter (Signed)
 Called pt to see if interested in rescheduling appt. Pt did not answer and could not leave VM.

## 2024-04-04 ENCOUNTER — Other Ambulatory Visit (INDEPENDENT_AMBULATORY_CARE_PROVIDER_SITE_OTHER): Payer: Self-pay | Admitting: Primary Care

## 2024-04-04 DIAGNOSIS — I10 Essential (primary) hypertension: Secondary | ICD-10-CM

## 2024-04-05 NOTE — Telephone Encounter (Signed)
 Requested medication (s) are due for refill today:   Yes for both  Requested medication (s) are on the active medication list:   Yes for both  Future visit scheduled:   No.    Multiple  No Shows.    Seen at the mobile unit by Cari on 02/18/2024.   Last ordered: 02/18/2024 #30, 0 refills for both    Unable to refill because OV needed.    30 day courtesy refill already given at mobile unit.    Requested Prescriptions  Pending Prescriptions Disp Refills   lisinopril -hydrochlorothiazide  (ZESTORETIC ) 20-25 MG tablet [Pharmacy Med Name: LISINOPRIL -HCTZ 20/25MG  TABLETS] 90 tablet     Sig: Take 1 tablet by mouth daily.     Cardiovascular:  ACEI + Diuretic Combos Failed - 04/05/2024  2:37 PM      Failed - Cr in normal range and within 180 days    Creatinine, Ser  Date Value Ref Range Status  02/18/2024 2.31 (H) 0.76 - 1.27 mg/dL Final   Creatinine,U  Date Value Ref Range Status  04/02/2023 118.8 mg/dL Final         Failed - Last BP in normal range    BP Readings from Last 1 Encounters:  02/23/24 (!) 140/100         Failed - Valid encounter within last 6 months    Recent Outpatient Visits           2 years ago Essential hypertension   [place] [facility] Marius Siemens, NP   2 years ago Essential hypertension   [place] [facility] Marius Siemens, NP   3 years ago Hospital discharge follow-up   [place] [facility] Marius Siemens, NP   3 years ago Encounter to establish care   [place] [facility] Marius Siemens, NP              Passed - Na in normal range and within 180 days    Sodium  Date Value Ref Range Status  02/18/2024 134 134 - 144 mmol/L Final         Passed - K in normal range and within 180 days    Potassium  Date Value Ref Range Status  02/18/2024 4.7 3.5 - 5.2 mmol/L Final         Passed - eGFR is 30 or above and within 180 days    GFR calc Af Amer  Date  Value Ref Range Status  11/14/2020 72 >59 mL/min/1.73 Final    Comment:    **In accordance with recommendations from the NKF-ASN Task force,**   [facility] is in the process of updating its eGFR calculation to the   2021 CKD-EPI creatinine equation that estimates kidney function   without a race variable.    GFR, Estimated  Date Value Ref Range Status  07/05/2023 29 (L) >60 mL/min Final    Comment:    (NOTE) Calculated using the CKD-EPI Creatinine Equation (2021)    GFR  Date Value Ref Range Status  04/02/2023 37.21 (L) >60.00 mL/min Final    Comment:    Calculated using the CKD-EPI Creatinine Equation (2021)   eGFR  Date Value Ref Range Status  02/18/2024 35 (L) >59 mL/min/1.73 Final         Passed - Patient is not pregnant       amLODipine  (NORVASC ) 10 MG tablet [Pharmacy Med Name: AMLODIPINE  BESYLATE 10MG  TABLETS] 90 tablet     Sig:  TAKE 1 TABLET(10 MG) BY MOUTH DAILY     Cardiovascular: Calcium  Channel Blockers 2 Failed - 04/05/2024  2:37 PM      Failed - Last BP in normal range    BP Readings from Last 1 Encounters:  02/23/24 (!) 140/100         Failed - Valid encounter within last 6 months    Recent Outpatient Visits           2 years ago Essential hypertension   [place] [facility] Marius Siemens, NP   2 years ago Essential hypertension   [place] [facility] Marius Siemens, NP   3 years ago Hospital discharge follow-up   [place] [facility] Marius Siemens, NP   3 years ago Encounter to establish care   [place] [facility] Marius Siemens, NP              Passed - Last Heart Rate in normal range    Pulse Readings from Last 1 Encounters:  02/23/24 (!) 110

## 2024-04-06 ENCOUNTER — Telehealth (INDEPENDENT_AMBULATORY_CARE_PROVIDER_SITE_OTHER): Payer: Self-pay | Admitting: Primary Care

## 2024-04-06 NOTE — Telephone Encounter (Signed)
 Called pt to confirm appt. Pt will be present.

## 2024-04-07 ENCOUNTER — Encounter (INDEPENDENT_AMBULATORY_CARE_PROVIDER_SITE_OTHER): Payer: Self-pay | Admitting: Primary Care

## 2024-04-07 ENCOUNTER — Ambulatory Visit (INDEPENDENT_AMBULATORY_CARE_PROVIDER_SITE_OTHER): Payer: Self-pay | Admitting: Primary Care

## 2024-04-07 VITALS — BP 148/84 | HR 96 | Resp 16 | Ht 72.0 in | Wt 209.0 lb

## 2024-04-07 DIAGNOSIS — I10 Essential (primary) hypertension: Secondary | ICD-10-CM

## 2024-04-07 DIAGNOSIS — N1832 Chronic kidney disease, stage 3b: Secondary | ICD-10-CM

## 2024-04-07 DIAGNOSIS — E559 Vitamin D deficiency, unspecified: Secondary | ICD-10-CM

## 2024-04-07 DIAGNOSIS — E782 Mixed hyperlipidemia: Secondary | ICD-10-CM | POA: Diagnosis not present

## 2024-04-07 DIAGNOSIS — H6121 Impacted cerumen, right ear: Secondary | ICD-10-CM

## 2024-04-07 DIAGNOSIS — E1022 Type 1 diabetes mellitus with diabetic chronic kidney disease: Secondary | ICD-10-CM

## 2024-04-07 DIAGNOSIS — Z2821 Immunization not carried out because of patient refusal: Secondary | ICD-10-CM | POA: Diagnosis not present

## 2024-04-07 DIAGNOSIS — Z1159 Encounter for screening for other viral diseases: Secondary | ICD-10-CM

## 2024-04-07 MED ORDER — LISINOPRIL-HYDROCHLOROTHIAZIDE 20-25 MG PO TABS: 1.0000 | ORAL_TABLET | Freq: Every day | ORAL | 1 refills | Status: AC

## 2024-04-07 MED ORDER — ERGOCALCIFEROL 1.25 MG (50000 UT) PO CAPS: 50000.0000 [IU] | ORAL_CAPSULE | ORAL | 0 refills | Status: AC

## 2024-04-07 MED ORDER — AMLODIPINE BESYLATE 10 MG PO TABS: 10.0000 mg | ORAL_TABLET | Freq: Every day | ORAL | 1 refills | Status: AC

## 2024-04-07 NOTE — Progress Notes (Signed)
 New Patient Office Visit  Subjective    Patient ID: Vincent Mcfarland male  DOB: 11-17-1980  Age: 43 y.o. MRN: [id number]   CC:  Refill Bp meds  HPI     New Patient (Initial Visit)    Additional comments: Re-establish         Hypertension    Additional comments: Been out of medication for 3-4 days       Last edited by Kathryne Parisian, RMA on 04/07/2024  9:10 AM.      HPI  Vincent Mcfarland in today to re-establish care. Bp elevated patient is aware and has denial Bp . Today it is 170/96 retake after discussing risk factors family history of CVA- father died age 62. Refer to cardiology. AMM, HTN and Type1 diabetes. Patient has No headache, No chest pain, No abdominal pain - No Nausea, No new weakness tingling or numbness, No Cough - shortness of breath/ Current Outpatient Medications on File Prior to Visit  Medication Sig Dispense Refill   amLODipine  (NORVASC ) 10 MG tablet Take 1 tablet (10 mg total) by mouth daily. 30 tablet 0   atorvastatin (LIPITOR) 40 MG tablet Take by mouth.     Blood Glucose Monitoring Suppl DEVI 1 each by Does not apply route in the morning, at noon, and at bedtime. May substitute to any manufacturer covered by patient's insurance. 1 each 0   Continuous Glucose Sensor (DEXCOM G7 SENSOR) MISC 1 Device by Does not apply route continuous. 9 each 1   Continuous Glucose Sensor (FREESTYLE LIBRE 3 PLUS SENSOR) MISC Change sensor every 15 days. 6 each 1   Continuous Glucose Sensor (FREESTYLE LIBRE 3 SENSOR) MISC 1 each by Does not apply route daily. Place 1 sensor on the skin every 14 days. Use to check glucose continuously 2 each 0   Glucagon  (BAQSIMI  ONE PACK) 3 MG/DOSE POWD Place 1 Device into the nose as needed (Low blood sugar with impaired consciousness). 2 each 3   Glucose Blood (BLOOD GLUCOSE TEST STRIPS) STRP 1 each by In Vitro route in the morning, at noon, and at bedtime. May substitute to any manufacturer covered by patient's insurance. 100 each 3    insulin  NPH Human (NOVOLIN N) 100 UNIT/ML injection Inject 0.75 mLs (75 Units total) into the skin every morning. And syringes 1/day 75 mL 3   insulin  regular (HUMULIN R ) 100 units/mL injection Inject 0.25 mLs (25 Units total) into the skin 3 (three) times daily before meals. 10 mL 11   Insulin  Syringes, Disposable, U-100 1 ML MISC 0.5 mLs by Does not apply route daily. INJECT UP TO 50 UNITS DAILY/BD INS SYRNG UF 0.5 ML [id number] 100 each 2   lisinopril -hydrochlorothiazide  (ZESTORETIC ) 20-25 MG tablet Take 1 tablet by mouth daily. 30 tablet 0   metoprolol  tartrate (LOPRESSOR ) 25 MG tablet Take 1 tablet (25 mg total) by mouth 2 (two) times daily. 60 tablet 11   Vitamin D , Ergocalciferol , (DRISDOL ) 1.25 MG (50000 UNIT) CAPS capsule Take 1 capsule (50,000 Units total) by mouth every 7 (seven) days. 4 capsule 2   No current facility-administered medications on file prior to visit.     No Known Allergies  Past Medical History:  Diagnosis Date   Diabetes mellitus without complication (HCC)    Hypertension      Past Surgical History:  Procedure Laterality Date   EYE SURGERY       Family History  Problem Relation Age of Onset   Healthy Mother  Aneurysm Father        brain   Hypertension Father    Diabetes Neg Hx     Social History   Socioeconomic History   Marital status: Married    Spouse name: Not on file   Number of children: Not on file   Years of education: Not on file   Highest education level: Not on file  Occupational History   Not on file  Tobacco Use   Smoking status: Never   Smokeless tobacco: Never  Vaping Use   Vaping status: Never Used  Substance and Sexual Activity   Alcohol use: No   Drug use: No   Sexual activity: Not on file  Other Topics Concern   Not on file  Social History Narrative   Dietary habits include: monitoring sodium intake and healthy snack hoices    Exercise habits include:walking , push ups   Social Drivers of Health   Financial  Resource Strain: Not on file  Food Insecurity: Not on file  Transportation Needs: Not on file  Physical Activity: Not on file  Stress: Not on file  Social Connections: Not on file  Intimate Partner Violence: Not on file   Health Maintenance  Topic Date Due   Eye exam for diabetics  Never done   Hepatitis C Screening  Never done   Complete foot exam   04/30/2022   COVID-19 Vaccine (1 - 2024-25 season) Never done   Pneumococcal Vaccination (1 of 2 - PCV) 04/07/2025*   HPV Vaccine (1 - Male 3-dose series) 04/07/2025*   Flu Shot  05/27/2024   Hemoglobin A1C  08/19/2024   DTaP/Tdap/Td vaccine (2 - Td or Tdap) 11/15/2024   Yearly kidney function blood test for diabetes  02/17/2025   Yearly kidney health urinalysis for diabetes  02/17/2025   HIV Screening  Completed   Meningitis B Vaccine  Aged Out  *Topic was postponed. The date shown is not the original due date.    Objective    BP (!) 159/106 (BP Location: Left Arm, Patient Position: Sitting, Cuff Size: Large)   Pulse 96   Resp 16   Ht 6' (1.829 m)   Wt 209 lb (94.8 kg)   SpO2 97%   BMI 28.35 kg/m  BP Readings from Last 3 Encounters:  04/07/24 (!) 159/106  02/23/24 (!) 140/100  02/18/24 (!) 196/109   Physical Exam Vitals reviewed.  HENT:     Head: Normocephalic.     Right Ear: External ear normal. There is impacted cerumen.     Left Ear: Tympanic membrane, ear canal and external ear normal.     Nose: Nose normal.   Eyes:     Extraocular Movements: Extraocular movements intact.     Pupils: Pupils are equal, round, and reactive to light.    Cardiovascular:     Rate and Rhythm: Normal rate and regular rhythm.  Pulmonary:     Effort: Pulmonary effort is normal.     Breath sounds: Normal breath sounds.  Abdominal:     General: Bowel sounds are normal. There is distension.     Palpations: Abdomen is soft.   Musculoskeletal:        General: Normal range of motion.   Skin:    General: Skin is warm and dry.    Neurological:     Mental Status: He is alert and oriented to person, place, and time.   Psychiatric:        Mood and Affect: Mood normal.  Behavior: Behavior normal.        Thought Content: Thought content normal.        Judgment: Judgment normal.     Assessment & Plan:  Makhari was seen today for new patient (initial visit) and hypertension.  Diagnoses and all orders for this visit:  Pneumococcal vaccination declined  Human papilloma virus (HPV) vaccination declined  Vitamin D  deficiency -     ergocalciferol  (VITAMIN D2) 1.25 MG (50000 UT) capsule; Take 1 capsule (50,000 Units total) by mouth once a week.  Hearing loss of right ear due to cerumen impaction Schedule a appointment for ear irrigation  Essential hypertension- BP goal - < 130/80 Explained that having normal blood pressure is the goal and medications are helping to get to goal and maintain normal blood pressure. DIET: Limit salt intake, read nutrition labels to check salt content, limit fried and high fatty foods  Avoid using multisymptom OTC cold preparations that generally contain sudafed which can rise BP. Consult with pharmacist on best cold relief products to use for persons with HTN EXERCISE Discussed incorporating exercise such as walking - 30 minutes most days of the week and can do in 10 minute intervals    Treated with Hyroxyzine 50mg  HTN urgency improvemnt -     amLODipine  (NORVASC ) 10 MG tablet; Take 1 tablet (10 mg total) by mouth daily. -     lisinopril -hydrochlorothiazide  (ZESTORETIC ) 20-25 MG tablet; Take 1 tablet by mouth daily.  Encounter for HCV screening test for low risk patient -     HCV Ab w Reflex to Quant PCR  Type 1 diabetes mellitus with stage 3b chronic kidney disease (HCC) Follow by endo. -     Ambulatory referral to Ophthalmology- patient to schedule appt  Mixed hyperlipidemia -     Lipid panel    Follow-up:  Return in about 3 months (around 07/08/2024).  The above  assessment and management plan was discussed with the patient. The patient verbalized understanding of and has agreed to the management plan. Patient is aware to call the clinic if symptoms fail to improve or worsen. Patient is aware when to return to the clinic for a follow-up visit. Patient educated on when it is appropriate to go to the emergency department.   Madelyn Schick, NP-C

## 2024-04-07 NOTE — Patient Instructions (Signed)
 Your Vitamin D  is low. Vitamin D  is needed to make and keep bones strong.  A prescription strength vitamin D  has been sent to your pharmacy.  You will take this once weekly for 10 weeks.  Then you will need to purchase  vitamin D  2000 iu over the counter  and take 1 tablet daily.  (Unfortunately the prescription vitamin D  is not covered by insurance)

## 2024-04-08 ENCOUNTER — Ambulatory Visit (INDEPENDENT_AMBULATORY_CARE_PROVIDER_SITE_OTHER): Payer: Self-pay | Admitting: Primary Care

## 2024-04-08 ENCOUNTER — Ambulatory Visit: Admitting: Dietician

## 2024-04-08 DIAGNOSIS — E782 Mixed hyperlipidemia: Secondary | ICD-10-CM

## 2024-04-08 LAB — LIPID PANEL
Chol/HDL Ratio: 3.7 ratio (ref 0.0–5.0)
Cholesterol, Total: 184 mg/dL (ref 100–199)
HDL: 50 mg/dL (ref >39)
LDL Chol Calc (NIH): 107 mg/dL — ABNORMAL HIGH (ref 0–99)
Triglycerides: 151 mg/dL — ABNORMAL HIGH (ref 0–149)
VLDL Cholesterol Cal: 27 mg/dL (ref 5–40)

## 2024-04-08 LAB — HCV AB W REFLEX TO QUANT PCR: HCV Ab: NONREACTIVE

## 2024-04-08 LAB — HCV INTERPRETATION

## 2024-04-08 MED ORDER — ATORVASTATIN CALCIUM 20 MG PO TABS: 20.0000 mg | ORAL_TABLET | Freq: Every day | ORAL | 3 refills | Status: AC

## 2024-04-20 ENCOUNTER — Telehealth (INDEPENDENT_AMBULATORY_CARE_PROVIDER_SITE_OTHER): Payer: Self-pay

## 2024-04-20 NOTE — Telephone Encounter (Signed)
 Pt has an appt on 05/06/24 it needs to be reschedule for 05/19/24 because A1C will be due at that time  Please reach out to pt

## 2024-05-05 ENCOUNTER — Telehealth (INDEPENDENT_AMBULATORY_CARE_PROVIDER_SITE_OTHER): Payer: Self-pay | Admitting: Primary Care

## 2024-05-05 NOTE — Telephone Encounter (Signed)
 Called pt to confirm appt. Pt would like to reschedule at this time.

## 2024-05-06 ENCOUNTER — Ambulatory Visit (INDEPENDENT_AMBULATORY_CARE_PROVIDER_SITE_OTHER): Admitting: Primary Care

## 2024-05-13 ENCOUNTER — Telehealth: Payer: Self-pay | Admitting: Primary Care

## 2024-05-13 NOTE — Telephone Encounter (Signed)
 Patient was identified as falling into the [facility] Measure - Diabetes.   Patient was: Patient is not currently using our practice.

## 2024-05-16 ENCOUNTER — Telehealth (INDEPENDENT_AMBULATORY_CARE_PROVIDER_SITE_OTHER): Payer: Self-pay | Admitting: Primary Care

## 2024-05-16 NOTE — Telephone Encounter (Signed)
 Called pt to confirm appt. Pt did not answer and LVM

## 2024-05-17 ENCOUNTER — Ambulatory Visit (INDEPENDENT_AMBULATORY_CARE_PROVIDER_SITE_OTHER): Payer: Self-pay | Admitting: Primary Care

## 2024-05-17 ENCOUNTER — Telehealth: Payer: Self-pay | Admitting: Primary Care

## 2024-05-17 ENCOUNTER — Telehealth (INDEPENDENT_AMBULATORY_CARE_PROVIDER_SITE_OTHER): Payer: Self-pay | Admitting: Primary Care

## 2024-05-17 NOTE — Telephone Encounter (Signed)
 Called pt to reschedule missed appt. Pt did not answer and LVM.

## 2024-05-17 NOTE — Telephone Encounter (Signed)
 Patient was identified as falling into the [facility] [place] Measure - Diabetes.   Patient was: Referred to Diabetes Management.

## 2024-05-30 ENCOUNTER — Ambulatory Visit: Admitting: Dietician
